# Patient Record
Sex: Male | Born: 1955 | Race: White | Hispanic: No | Smoking: Never smoker
Health system: Southern US, Community
[De-identification: ages and names within clinical notes are randomized; demographics above are authoritative.]

## PROBLEM LIST (undated history)

## (undated) DIAGNOSIS — T4145XA Adverse effect of unspecified anesthetic, initial encounter: Secondary | ICD-10-CM

## (undated) DIAGNOSIS — F419 Anxiety disorder, unspecified: Secondary | ICD-10-CM

## (undated) DIAGNOSIS — I10 Essential (primary) hypertension: Secondary | ICD-10-CM

## (undated) DIAGNOSIS — T8859XA Other complications of anesthesia, initial encounter: Secondary | ICD-10-CM

## (undated) DIAGNOSIS — Z9109 Other allergy status, other than to drugs and biological substances: Secondary | ICD-10-CM

## (undated) DIAGNOSIS — Z91030 Bee allergy status: Secondary | ICD-10-CM

## (undated) DIAGNOSIS — L570 Actinic keratosis: Secondary | ICD-10-CM

## (undated) HISTORY — DX: Other allergy status, other than to drugs and biological substances: Z91.09

## (undated) HISTORY — DX: Actinic keratosis: L57.0

## (undated) HISTORY — PX: INGUINAL HERNIA REPAIR: SHX194

## (undated) HISTORY — DX: Essential (primary) hypertension: I10

## (undated) HISTORY — DX: Anxiety disorder, unspecified: F41.9

## (undated) HISTORY — DX: Bee allergy status: Z91.030

---

## 2016-06-17 ENCOUNTER — Telehealth: Payer: Self-pay

## 2016-06-17 NOTE — Telephone Encounter (Signed)
Pt called and is interested in doing the Cologuard first. Michela Pitcher he is not having any problems at all. Please advise!

## 2016-06-17 NOTE — Telephone Encounter (Signed)
PT left a Vm that he received letter for screening colonoscopy and wants to discuss options.  I called and LMOM for a return call.

## 2016-06-17 NOTE — Telephone Encounter (Signed)
Cologuard is a stool test. We have not established care with patient so we cannot order that like we triage patient's for colonoscopies.   He can come in to be seen, we can discuss the test and order if that is what he wants to do.   It is important to know that Cologuard is not a colon cancer PREVENTION tool. It is a tool to SCREEN for colon cancer and if it is positive, then he will need a colonoscopy.

## 2016-06-20 NOTE — Telephone Encounter (Signed)
Pt is aware. OV with Neil Crouch, PA on 07/08/2016 at 11:00 Am .

## 2016-07-08 ENCOUNTER — Encounter: Payer: Self-pay | Admitting: Gastroenterology

## 2016-07-08 ENCOUNTER — Ambulatory Visit (INDEPENDENT_AMBULATORY_CARE_PROVIDER_SITE_OTHER): Payer: 59 | Admitting: Gastroenterology

## 2016-07-08 DIAGNOSIS — Z1211 Encounter for screening for malignant neoplasm of colon: Secondary | ICD-10-CM | POA: Diagnosis not present

## 2016-07-08 NOTE — Progress Notes (Signed)
Primary Care Physician:  Robert Bellow, MD  Primary Gastroenterologist:  Garfield Cornea, MD   Chief Complaint  Patient presents with  . Advice Only    HPI:  Philip Johnson is a 60 y.o. male here To discuss screening for colon cancer. He is not interested in colonoscopy. He would like to consider Cologuard. He has done his research. We discussed that this test screens for colon cancer but is not used for prevention of colon cancer as a colonoscopy is. He is fully aware. He does note that if the test is positive he is willing to undergo colonoscopy. He denies any bowel concerns. No blood in the stool or melena. No constipation, diarrhea, abdominal pain, heartburn, dysphagia, vomiting, weight loss. No known family history of colon cancer.  Current Outpatient Prescriptions  Medication Sig Dispense Refill  . atenolol (TENORMIN) 25 MG tablet 25 mg daily.     . diazepam (VALIUM) 5 MG tablet Take 5 mg by mouth every 6 (six) hours as needed.     Marland Kitchen EPIPEN 2-PAK 0.3 MG/0.3ML SOAJ injection 0.3 mg once.     . fluticasone (FLONASE) 50 MCG/ACT nasal spray Place 2 sprays into both nostrils daily.      No current facility-administered medications for this visit.     Allergies as of 07/08/2016 - Review Complete 07/08/2016  Allergen Reaction Noted  . Bee venom Anaphylaxis 07/08/2016    Past Medical History:  Diagnosis Date  . Anxiety   . Hypertension   . Multiple environmental allergies   . Yellow jacket sting allergy     Past Surgical History:  Procedure Laterality Date  . INGUINAL HERNIA REPAIR     right    Family History  Problem Relation Age of Onset  . Prostate cancer Paternal Grandfather   . Prostate cancer Paternal Uncle   . Colon cancer Neg Hx     Social History   Social History  . Marital status: Married    Spouse name: N/A  . Number of children: N/A  . Years of education: N/A   Occupational History  . Not on file.   Social History Main Topics  . Smoking status:  Never Smoker  . Smokeless tobacco: Never Used  . Alcohol use Yes     Comment: socially, per PCP chart, etoh dependence in remission  . Drug use: No  . Sexual activity: Not on file   Other Topics Concern  . Not on file   Social History Narrative   Works at [employer]:  General: Negative for anorexia, weight loss, fever, chills, fatigue, weakness. Eyes: Negative for vision changes.  ENT: Negative for hoarseness, difficulty swallowing , nasal congestion. CV: Negative for chest pain, angina, palpitations, dyspnea on exertion, peripheral edema.  Respiratory: Negative for dyspnea at rest, dyspnea on exertion, cough, sputum, wheezing.  GI: See history of present illness. GU:  Negative for dysuria, hematuria, urinary incontinence, urinary frequency, nocturnal urination.  MS: Negative for joint pain, low back pain.  Derm: Negative for rash or itching.  Neuro: Negative for weakness, abnormal sensation, seizure, frequent headaches, memory loss, confusion.  Psych: Negative for anxiety, depression, suicidal ideation, hallucinations.  Endo: Negative for unusual weight change.  Heme: Negative for bruising or bleeding. Allergy: Negative for rash or hives.    Physical Examination:  BP 133/80   Pulse (!) 56   Temp 98 F (36.7 C) (Oral)   Ht 5\' 4"  (1.626 m)   Wt 139 lb (  63 kg)   BMI 23.86 kg/m    General: Well-nourished, well-developed in no acute distress.  Head: Normocephalic, atraumatic.   Eyes: Conjunctiva pink, no icterus. Mouth: Oropharyngeal mucosa moist and pink , no lesions erythema or exudate. Neck: Supple without thyromegaly, masses, or lymphadenopathy.  Lungs: Clear to auscultation bilaterally.  Heart: Regular rate and rhythm, no murmurs rubs or gallops.  Abdomen: Bowel sounds are normal, nontender, nondistended, no hepatosplenomegaly or masses, no abdominal bruits or    hernia , no rebound or guarding.   Rectal: Not performed Extremities: No lower  extremity edema. No clubbing or deformities.  Neuro: Alert and oriented x 4 , grossly normal neurologically.  Skin: Warm and dry, no rash or jaundice.   Psych: Alert and cooperative, normal mood and affect.  Labs: None available  Imaging Studies: No results found.

## 2016-07-08 NOTE — Assessment & Plan Note (Signed)
60 year old gentleman with no prior colonoscopy he would like to be screened for colon cancer via Cologuard. He tells me he accepts the risk that this is not a preventative tool but unless he has a positive Cologuard test he does not plan to have colonoscopy for preventative reasons. Plan for Cologuard in near future.

## 2016-07-08 NOTE — Patient Instructions (Signed)
1. Please let us know when you have sent the Cologuard test off. We will contact you with results when available.

## 2016-07-11 NOTE — Progress Notes (Signed)
cc'ed to pcp °

## 2016-07-24 ENCOUNTER — Encounter: Payer: Self-pay | Admitting: Gastroenterology

## 2016-07-25 ENCOUNTER — Telehealth: Payer: Self-pay | Admitting: Gastroenterology

## 2016-07-25 NOTE — Telephone Encounter (Signed)
PATIENT CALLED AND STATED THAT HE MAILED HIS COLOGUARD THIS MORNING

## 2016-07-27 NOTE — Telephone Encounter (Signed)
Noted. Let's make sure we keep look out for results.

## 2016-07-29 LAB — COLOGUARD: COLOGUARD: NEGATIVE

## 2016-08-01 NOTE — Telephone Encounter (Signed)
Received cologuard results and placed them in LSL cart.

## 2016-08-02 NOTE — Telephone Encounter (Signed)
Pt is aware of results. 

## 2016-08-02 NOTE — Telephone Encounter (Signed)
Please let patient know his Cologuard test was negative.  Would recommend another Cologuard test in 3 years.  He could consider yearly hemoccult testing via PCP as well.  Send copy of results to PCP.

## 2016-08-02 NOTE — Telephone Encounter (Signed)
LMOM to call back

## 2016-12-26 ENCOUNTER — Ambulatory Visit (HOSPITAL_COMMUNITY)
Admission: EM | Admit: 2016-12-26 | Discharge: 2016-12-26 | Disposition: A | Discharge status: Home / Self Care | Payer: Commercial Managed Care - HMO | Attending: Internal Medicine | Admitting: Internal Medicine

## 2016-12-26 ENCOUNTER — Encounter (HOSPITAL_COMMUNITY): Payer: Self-pay | Admitting: Emergency Medicine

## 2016-12-26 DIAGNOSIS — R0981 Nasal congestion: Secondary | ICD-10-CM

## 2016-12-26 DIAGNOSIS — Z87898 Personal history of other specified conditions: Secondary | ICD-10-CM

## 2016-12-26 DIAGNOSIS — B349 Viral infection, unspecified: Secondary | ICD-10-CM

## 2016-12-26 MED ORDER — BENZONATATE 200 MG PO CAPS: 200.0000 mg | ORAL_CAPSULE | Freq: Three times a day (TID) | ORAL | 1 refills | Status: DC | PRN

## 2016-12-26 NOTE — ED Provider Notes (Signed)
[facility]    CSN: [id number] Arrival date & time: 12/26/16  1007     History   Chief Complaint Chief Complaint  Patient presents with  . Cough    HPI ZAKHARI FOGEL is a 61 y.o. male. He presents today with onset of throbbing headache and fever 3d ago.  Temp 102 on 4/7, 100 yesterday.  Headache improving.  Now with some cough, scant production.  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  Requests 7d work note.  Missed work 4/6.       HPI  Past Medical History:  Diagnosis Date  . Anxiety   . Hypertension   . Multiple environmental allergies   . Yellow jacket sting allergy     Patient Active Problem List   Diagnosis Date Noted  . Colon cancer screening 07/08/2016    Past Surgical History:  Procedure Laterality Date  . INGUINAL HERNIA REPAIR     right       Home Medications    Prior to Admission medications   Medication Sig Start Date End Date Taking? Authorizing Provider  atenolol (TENORMIN) 25 MG tablet 25 mg daily.  06/14/16  Yes Historical Provider, MD  diazepam (VALIUM) 5 MG tablet Take 5 mg by mouth every 6 (six) hours as needed.  06/14/16  Yes Historical Provider, MD  EPIPEN 2-PAK 0.3 MG/0.3ML SOAJ injection 0.3 mg once.  04/15/16  Yes Historical Provider, MD  fexofenadine-pseudoephedrine (ALLEGRA-D 24) 180-240 MG 24 hr tablet Take 1 tablet by mouth daily.   Yes Historical Provider, MD  fluticasone (FLONASE) 50 MCG/ACT nasal spray Place 2 sprays into both nostrils daily.  06/14/16  Yes Historical Provider, MD  benzonatate (TESSALON) 200 MG capsule Take 1 capsule (200 mg total) by mouth 3 (three) times daily as needed for cough. 12/26/16   Sherlene Shams, MD    Family History Family History  Problem Relation Age of Onset  . Prostate cancer Paternal Grandfather   . Prostate cancer Paternal Uncle   . Colon cancer Neg Hx     Social History Social History  Substance Use Topics  .  Smoking status: Never Smoker  . Smokeless tobacco: Never Used  . Alcohol use Yes     Comment: socially, per PCP chart, etoh dependence in remission     Allergies   Bee venom   Review of Systems Review of Systems  All other systems reviewed and are negative.    Physical Exam Triage Vital Signs ED Triage Vitals  Enc Vitals Group     BP 12/26/16 1026 116/78     Pulse Rate 12/26/16 1026 74     Resp 12/26/16 1026 16     Temp 12/26/16 1026 97.9 F (36.6 C)     Temp Source 12/26/16 1026 Oral     SpO2 12/26/16 1026 98 %     Weight --      Height --      Pain Score 12/26/16 1030 3     Pain Loc --    Updated Vital Signs BP 116/78 (BP Location: Left Arm)   Pulse 74   Temp 97.9 F (36.6 C) (Oral)   Resp 16   SpO2 98%   Physical Exam  Constitutional: He is oriented to person, place, and time. No distress.  Alert, nicely groomed  HENT:  Head: Atraumatic.  B TMs moderately dull, no erythema Moderate nasal congestion with scant mucusy material present Throat injected with  post nasal drainage evident  Eyes:  Conjugate gaze, no eye redness/drainage  Neck: Neck supple.  Cardiovascular: Normal rate and regular rhythm.   Pulmonary/Chest: No respiratory distress. He has no wheezes. He has no rales.  Lungs clear, symmetric breath sounds   Abdominal: He exhibits no distension.  Musculoskeletal: Normal range of motion.  No distal leg swelling   Neurological: He is alert and oriented to person, place, and time.  Skin: Skin is warm and dry.  No cyanosis.  Pink   Nursing note and vitals reviewed.    UC Treatments / Results   Procedures Procedures (including critical care time) None today  Final Clinical Impressions(s) / UC Diagnoses   Final diagnoses:  Nonspecific syndrome suggestive of viral illness  History of fever  Sinus congestion   Symptoms and physical exam today suggest a viral respiratory infection, which is improving by your report.  Fever has resolved  and headache is improving.  Recheck if new fever >100.5, increasing phlegm production/nasal discharge, or if not starting to improve.  Please recheck with your primary care provider if symptoms are not continuing to improve or further extension of the work note is needed.  Prescriptions for benzonatate ( a cough gel) was sent to the [facility] on [address].    New Prescriptions Discharge Medication List as of 12/26/2016 11:05 AM    START taking these medications   Details  benzonatate (TESSALON) 200 MG capsule Take 1 capsule (200 mg total) by mouth 3 (three) times daily as needed for cough., Starting Mon 12/26/2016, Normal         Sherlene Shams, MD 12/28/16 2303

## 2016-12-26 NOTE — Discharge Instructions (Addendum)
Symptoms and physical exam today suggest a viral respiratory infection, which is improving by your report.  Fever has resolved and headache is improving.  Recheck if new fever >100.5, increasing phlegm production/nasal discharge, or if not starting to improve.  Please recheck with your primary care provider if symptoms are not continuing to improve or further extension of the work note is needed.  Prescriptions for benzonatate ( a cough gel) was sent to the [facility] on [address].

## 2016-12-26 NOTE — ED Triage Notes (Signed)
The patient presented to the [facility] with a complaint of a headache , fever and cough off and on for 4 days.

## 2018-01-16 ENCOUNTER — Ambulatory Visit: Payer: Self-pay | Admitting: General Surgery

## 2018-03-29 ENCOUNTER — Encounter (HOSPITAL_BASED_OUTPATIENT_CLINIC_OR_DEPARTMENT_OTHER): Payer: Self-pay | Admitting: *Deleted

## 2018-03-30 ENCOUNTER — Encounter (HOSPITAL_BASED_OUTPATIENT_CLINIC_OR_DEPARTMENT_OTHER)
Admission: RE | Admit: 2018-03-30 | Discharge: 2018-03-30 | Disposition: A | Discharge status: Home / Self Care | Payer: 59 | Source: Ambulatory Visit | Attending: General Surgery | Admitting: General Surgery

## 2018-03-30 ENCOUNTER — Ambulatory Visit
Admission: RE | Admit: 2018-03-30 | Discharge: 2018-03-30 | Disposition: A | Discharge status: Home / Self Care | Payer: 59 | Source: Ambulatory Visit | Attending: General Surgery | Admitting: General Surgery

## 2018-03-30 ENCOUNTER — Other Ambulatory Visit: Payer: Self-pay | Admitting: General Surgery

## 2018-03-30 DIAGNOSIS — F419 Anxiety disorder, unspecified: Secondary | ICD-10-CM | POA: Diagnosis not present

## 2018-03-30 DIAGNOSIS — Z79899 Other long term (current) drug therapy: Secondary | ICD-10-CM | POA: Diagnosis not present

## 2018-03-30 DIAGNOSIS — Z01811 Encounter for preprocedural respiratory examination: Secondary | ICD-10-CM

## 2018-03-30 DIAGNOSIS — K4090 Unilateral inguinal hernia, without obstruction or gangrene, not specified as recurrent: Secondary | ICD-10-CM | POA: Diagnosis present

## 2018-03-30 DIAGNOSIS — I10 Essential (primary) hypertension: Secondary | ICD-10-CM | POA: Diagnosis not present

## 2018-03-30 DIAGNOSIS — Z91030 Bee allergy status: Secondary | ICD-10-CM | POA: Diagnosis not present

## 2018-03-30 DIAGNOSIS — Z87891 Personal history of nicotine dependence: Secondary | ICD-10-CM | POA: Diagnosis not present

## 2018-03-30 LAB — BASIC METABOLIC PANEL
Anion gap: 6 (ref 5–15)
BUN: 22 mg/dL (ref 8–23)
CALCIUM: 8.9 mg/dL (ref 8.9–10.3)
CO2: 29 mmol/L (ref 22–32)
CREATININE: 0.89 mg/dL (ref 0.61–1.24)
Chloride: 107 mmol/L (ref 98–111)
GFR calc Af Amer: >60 mL/min (ref >60)
Glucose, Bld: 114 mg/dL — ABNORMAL HIGH (ref 70–99)
POTASSIUM: 4.3 mmol/L (ref 3.5–5.1)
SODIUM: 142 mmol/L (ref 135–145)

## 2018-03-30 LAB — CBC WITH DIFFERENTIAL/PLATELET
Abs Immature Granulocytes: 0 10*3/uL (ref 0.0–0.1)
BASOS ABS: 0 10*3/uL (ref 0.0–0.1)
Basophils Relative: 1 %
EOS PCT: 3 %
Eosinophils Absolute: 0.2 10*3/uL (ref 0.0–0.7)
HCT: 44.9 % (ref 39.0–52.0)
Hemoglobin: 14.6 g/dL (ref 13.0–17.0)
Immature Granulocytes: 0 %
Lymphocytes Relative: 29 %
Lymphs Abs: 1.7 10*3/uL (ref 0.7–4.0)
MCH: 31.9 pg (ref 26.0–34.0)
MCHC: 32.5 g/dL (ref 30.0–36.0)
MCV: 98 fL (ref 78.0–100.0)
Monocytes Absolute: 0.4 10*3/uL (ref 0.1–1.0)
Monocytes Relative: 7 %
Neutro Abs: 3.5 10*3/uL (ref 1.7–7.7)
Neutrophils Relative %: 60 %
Platelets: 244 10*3/uL (ref 150–400)
RBC: 4.58 MIL/uL (ref 4.22–5.81)
RDW: 12.5 % (ref 11.5–15.5)
WBC: 5.8 10*3/uL (ref 4.0–10.5)

## 2018-03-30 NOTE — Progress Notes (Signed)
Ensure pre surgery drink given with instructions to complete by [facility], pt verbalized understanding.  Faxed order for chest xray to [facility] imaging, gave pt instructions and directions, pt verbalized understanding.

## 2018-04-05 NOTE — Anesthesia Preprocedure Evaluation (Addendum)
Anesthesia Evaluation  Patient identified by MRN, date of birth, ID band Patient awake    Reviewed: Allergy & Precautions, NPO status , Patient's Chart, lab work & pertinent test results, reviewed documented beta blocker date and time   Airway Mallampati: II  TM Distance: >3 FB Neck ROM: Full    Dental no notable dental hx. (+) Teeth Intact, Caps   Pulmonary neg pulmonary ROS,    Pulmonary exam normal breath sounds clear to auscultation       Cardiovascular hypertension, Pt. on medications and Pt. on home beta blockers Normal cardiovascular exam Rhythm:Regular Rate:Normal     Neuro/Psych Anxiety negative neurological ROS     GI/Hepatic negative GI ROS,   Endo/Other    Renal/GU      Musculoskeletal   Abdominal   Peds  Hematology negative hematology ROS (+)   Anesthesia Other Findings   Reproductive/Obstetrics                             Lab Results  Component Value Date   WBC 5.8 03/30/2018   HGB 14.6 03/30/2018   HCT 44.9 03/30/2018   MCV 98.0 03/30/2018   PLT 244 03/30/2018    Anesthesia Physical Anesthesia Plan  ASA: II  Anesthesia Plan: General   Post-op Pain Management:  Regional for Post-op pain   Induction: Intravenous  PONV Risk Score and Plan: Treatment may vary due to age or medical condition, Dexamethasone and Ondansetron  Airway Management Planned: Oral ETT  Additional Equipment:   Intra-op Plan:   Post-operative Plan:   Informed Consent: I have reviewed the patients History and Physical, chart, labs and discussed the procedure including the risks, benefits and alternatives for the proposed anesthesia with the patient or authorized representative who has indicated his/her understanding and acceptance.   Dental advisory given  Plan Discussed with: CRNA  Anesthesia Plan Comments: (TAP block)        Anesthesia Quick Evaluation

## 2018-04-06 ENCOUNTER — Ambulatory Visit (HOSPITAL_COMMUNITY): Payer: 59

## 2018-04-06 ENCOUNTER — Ambulatory Visit (HOSPITAL_BASED_OUTPATIENT_CLINIC_OR_DEPARTMENT_OTHER)
Admission: RE | Admit: 2018-04-06 | Discharge: 2018-04-06 | Disposition: A | Discharge status: Home / Self Care | Payer: 59 | Source: Ambulatory Visit | Attending: General Surgery | Admitting: General Surgery

## 2018-04-06 ENCOUNTER — Ambulatory Visit (HOSPITAL_BASED_OUTPATIENT_CLINIC_OR_DEPARTMENT_OTHER): Payer: 59 | Admitting: Anesthesiology

## 2018-04-06 ENCOUNTER — Encounter (HOSPITAL_BASED_OUTPATIENT_CLINIC_OR_DEPARTMENT_OTHER): Payer: Self-pay

## 2018-04-06 ENCOUNTER — Other Ambulatory Visit: Payer: Self-pay

## 2018-04-06 ENCOUNTER — Encounter (HOSPITAL_BASED_OUTPATIENT_CLINIC_OR_DEPARTMENT_OTHER)
Admission: RE | Disposition: A | Discharge status: Home / Self Care | Payer: Self-pay | Source: Ambulatory Visit | Attending: General Surgery

## 2018-04-06 DIAGNOSIS — I10 Essential (primary) hypertension: Secondary | ICD-10-CM | POA: Insufficient documentation

## 2018-04-06 DIAGNOSIS — Z01818 Encounter for other preprocedural examination: Secondary | ICD-10-CM

## 2018-04-06 DIAGNOSIS — K4090 Unilateral inguinal hernia, without obstruction or gangrene, not specified as recurrent: Secondary | ICD-10-CM | POA: Insufficient documentation

## 2018-04-06 DIAGNOSIS — Z79899 Other long term (current) drug therapy: Secondary | ICD-10-CM | POA: Insufficient documentation

## 2018-04-06 DIAGNOSIS — F419 Anxiety disorder, unspecified: Secondary | ICD-10-CM | POA: Insufficient documentation

## 2018-04-06 DIAGNOSIS — Z87891 Personal history of nicotine dependence: Secondary | ICD-10-CM | POA: Insufficient documentation

## 2018-04-06 DIAGNOSIS — Z91030 Bee allergy status: Secondary | ICD-10-CM | POA: Insufficient documentation

## 2018-04-06 HISTORY — DX: Adverse effect of unspecified anesthetic, initial encounter: T41.45XA

## 2018-04-06 HISTORY — PX: INSERTION OF MESH: SHX5868

## 2018-04-06 HISTORY — DX: Other complications of anesthesia, initial encounter: T88.59XA

## 2018-04-06 HISTORY — PX: INGUINAL HERNIA REPAIR: SHX194

## 2018-04-06 SURGERY — REPAIR, HERNIA, INGUINAL, ADULT
Anesthesia: General | Site: Groin | Laterality: Left

## 2018-04-06 MED ORDER — PROPOFOL 10 MG/ML IV BOLUS
INTRAVENOUS | Status: DC | PRN
  Administered 2018-04-06: 180 mg via INTRAVENOUS

## 2018-04-06 MED ORDER — CEFAZOLIN SODIUM-DEXTROSE 2-4 GM/100ML-% IV SOLN
INTRAVENOUS | Status: AC
  Filled 2018-04-06: qty 100

## 2018-04-06 MED ORDER — PROPOFOL 10 MG/ML IV BOLUS
INTRAVENOUS | Status: AC
  Filled 2018-04-06: qty 40

## 2018-04-06 MED ORDER — LIDOCAINE HCL (PF) 1 % IJ SOLN
INTRAMUSCULAR | Status: AC
  Filled 2018-04-06: qty 30

## 2018-04-06 MED ORDER — GABAPENTIN 300 MG PO CAPS
ORAL_CAPSULE | ORAL | Status: AC
  Filled 2018-04-06: qty 1

## 2018-04-06 MED ORDER — CHLORHEXIDINE GLUCONATE CLOTH 2 % EX PADS: 6.0000 | MEDICATED_PAD | Freq: Once | CUTANEOUS | Status: DC

## 2018-04-06 MED ORDER — HYDROCODONE-ACETAMINOPHEN 5-325 MG PO TABS
ORAL_TABLET | ORAL | Status: AC
  Filled 2018-04-06: qty 1

## 2018-04-06 MED ORDER — BUPIVACAINE HCL (PF) 0.5 % IJ SOLN
INTRAMUSCULAR | Status: AC
  Filled 2018-04-06: qty 30

## 2018-04-06 MED ORDER — LIDOCAINE HCL (CARDIAC) PF 100 MG/5ML IV SOSY
PREFILLED_SYRINGE | INTRAVENOUS | Status: DC | PRN
  Administered 2018-04-06: 60 mg via INTRAVENOUS

## 2018-04-06 MED ORDER — MIDAZOLAM HCL 2 MG/2ML IJ SOLN
1.0000 mg | INTRAMUSCULAR | Status: DC | PRN
  Administered 2018-04-06: 2 mg via INTRAVENOUS
  Administered 2018-04-06 (×2): 1 mg via INTRAVENOUS

## 2018-04-06 MED ORDER — ROCURONIUM BROMIDE 10 MG/ML (PF) SYRINGE
PREFILLED_SYRINGE | INTRAVENOUS | Status: AC
  Filled 2018-04-06: qty 10

## 2018-04-06 MED ORDER — ACETAMINOPHEN 500 MG PO TABS
ORAL_TABLET | ORAL | Status: AC
  Filled 2018-04-06: qty 2

## 2018-04-06 MED ORDER — SODIUM BICARBONATE 4 % IV SOLN
INTRAVENOUS | Status: AC
  Filled 2018-04-06: qty 5

## 2018-04-06 MED ORDER — FENTANYL CITRATE (PF) 100 MCG/2ML IJ SOLN
50.0000 ug | INTRAMUSCULAR | Status: DC | PRN
  Administered 2018-04-06 (×2): 50 ug via INTRAVENOUS

## 2018-04-06 MED ORDER — DIPHENHYDRAMINE HCL 50 MG/ML IJ SOLN
INTRAMUSCULAR | Status: DC | PRN
  Administered 2018-04-06: 25 mg via INTRAVENOUS

## 2018-04-06 MED ORDER — GABAPENTIN 300 MG PO CAPS
300.0000 mg | ORAL_CAPSULE | ORAL | Status: AC
  Administered 2018-04-06: 300 mg via ORAL

## 2018-04-06 MED ORDER — BUPIVACAINE-EPINEPHRINE (PF) 0.5% -1:200000 IJ SOLN
INTRAMUSCULAR | Status: AC
  Filled 2018-04-06: qty 30

## 2018-04-06 MED ORDER — CEFAZOLIN SODIUM-DEXTROSE 2-3 GM-%(50ML) IV SOLR
INTRAVENOUS | Status: DC | PRN
  Administered 2018-04-06: 2 g via INTRAVENOUS

## 2018-04-06 MED ORDER — MIDAZOLAM HCL 2 MG/2ML IJ SOLN
INTRAMUSCULAR | Status: AC
  Filled 2018-04-06: qty 2

## 2018-04-06 MED ORDER — CELECOXIB 200 MG PO CAPS
ORAL_CAPSULE | ORAL | Status: AC
  Filled 2018-04-06: qty 1

## 2018-04-06 MED ORDER — SODIUM CHLORIDE 0.9 % IV SOLN
INTRAVENOUS | Status: DC | PRN
  Administered 2018-04-06: 500 mL

## 2018-04-06 MED ORDER — LIDOCAINE HCL (CARDIAC) PF 100 MG/5ML IV SOSY
PREFILLED_SYRINGE | INTRAVENOUS | Status: AC
  Filled 2018-04-06: qty 5

## 2018-04-06 MED ORDER — CELECOXIB 200 MG PO CAPS
200.0000 mg | ORAL_CAPSULE | ORAL | Status: AC
  Administered 2018-04-06: 200 mg via ORAL

## 2018-04-06 MED ORDER — MEPERIDINE HCL 25 MG/ML IJ SOLN: 6.2500 mg | INTRAMUSCULAR | Status: DC | PRN

## 2018-04-06 MED ORDER — PROMETHAZINE HCL 25 MG/ML IJ SOLN: 6.2500 mg | INTRAMUSCULAR | Status: DC | PRN

## 2018-04-06 MED ORDER — ACETAMINOPHEN 10 MG/ML IV SOLN: 1000.0000 mg | Freq: Once | INTRAVENOUS | Status: DC | PRN

## 2018-04-06 MED ORDER — OXYCODONE HCL 5 MG PO TABS: 5.0000 mg | ORAL_TABLET | Freq: Four times a day (QID) | ORAL | 0 refills | Status: DC | PRN

## 2018-04-06 MED ORDER — SCOPOLAMINE 1 MG/3DAYS TD PT72: 1.0000 | MEDICATED_PATCH | Freq: Once | TRANSDERMAL | Status: DC | PRN

## 2018-04-06 MED ORDER — DEXAMETHASONE SODIUM PHOSPHATE 10 MG/ML IJ SOLN
INTRAMUSCULAR | Status: DC | PRN
  Administered 2018-04-06: 10 mg via INTRAVENOUS

## 2018-04-06 MED ORDER — BUPIVACAINE-EPINEPHRINE 0.5% -1:200000 IJ SOLN
INTRAMUSCULAR | Status: DC | PRN
  Administered 2018-04-06: 10 mL

## 2018-04-06 MED ORDER — HYDROCODONE-ACETAMINOPHEN 7.5-325 MG PO TABS
1.0000 | ORAL_TABLET | Freq: Once | ORAL | Status: AC | PRN
  Administered 2018-04-06: 1 via ORAL

## 2018-04-06 MED ORDER — LACTATED RINGERS IV SOLN
INTRAVENOUS | Status: DC
  Administered 2018-04-06 (×2): via INTRAVENOUS

## 2018-04-06 MED ORDER — HYDROMORPHONE HCL 1 MG/ML IJ SOLN: 0.2500 mg | INTRAMUSCULAR | Status: DC | PRN

## 2018-04-06 MED ORDER — DEXAMETHASONE SODIUM PHOSPHATE 10 MG/ML IJ SOLN
INTRAMUSCULAR | Status: AC
  Filled 2018-04-06: qty 1

## 2018-04-06 MED ORDER — SODIUM CHLORIDE 0.9 % IV SOLN
INTRAVENOUS | Status: AC
  Filled 2018-04-06: qty 500000

## 2018-04-06 MED ORDER — FENTANYL CITRATE (PF) 100 MCG/2ML IJ SOLN
INTRAMUSCULAR | Status: AC
  Filled 2018-04-06: qty 2

## 2018-04-06 MED ORDER — ONDANSETRON HCL 4 MG/2ML IJ SOLN
INTRAMUSCULAR | Status: AC
  Filled 2018-04-06: qty 2

## 2018-04-06 MED ORDER — ROPIVACAINE HCL 5 MG/ML IJ SOLN
INTRAMUSCULAR | Status: DC | PRN
  Administered 2018-04-06: 25 mL

## 2018-04-06 MED ORDER — CEFAZOLIN SODIUM-DEXTROSE 2-4 GM/100ML-% IV SOLN: 2.0000 g | INTRAVENOUS | Status: DC

## 2018-04-06 MED ORDER — ACETAMINOPHEN 500 MG PO TABS
1000.0000 mg | ORAL_TABLET | ORAL | Status: AC
  Administered 2018-04-06: 1000 mg via ORAL

## 2018-04-06 SURGICAL SUPPLY — 54 items
BAG DECANTER FOR FLEXI CONT (MISCELLANEOUS) ×3 IMPLANT
BLADE CLIPPER SURG (BLADE) ×3 IMPLANT
BLADE SURG 10 STRL SS (BLADE) ×3 IMPLANT
BLADE SURG 15 STRL LF DISP TIS (BLADE) ×2 IMPLANT
BLADE SURG 15 STRL SS (BLADE) ×4
CANISTER SUCT 1200ML W/VALVE (MISCELLANEOUS) IMPLANT
CHLORAPREP W/TINT 26ML (MISCELLANEOUS) ×3 IMPLANT
CLEANER CAUTERY TIP 5X5 PAD (MISCELLANEOUS) ×1 IMPLANT
CLOSURE WOUND 1/2 X4 (GAUZE/BANDAGES/DRESSINGS) ×1
COVER BACK TABLE 60X90IN (DRAPES) ×3 IMPLANT
COVER MAYO STAND STRL (DRAPES) ×3 IMPLANT
DECANTER SPIKE VIAL GLASS SM (MISCELLANEOUS) ×3 IMPLANT
DERMABOND ADVANCED (GAUZE/BANDAGES/DRESSINGS) ×2
DERMABOND ADVANCED .7 DNX12 (GAUZE/BANDAGES/DRESSINGS) ×1 IMPLANT
DRAIN PENROSE 1/2X12 LTX STRL (WOUND CARE) ×3 IMPLANT
DRAPE LAPAROTOMY TRNSV 102X78 (DRAPE) ×3 IMPLANT
DRAPE UTILITY XL STRL (DRAPES) ×3 IMPLANT
DRSG TEGADERM 2-3/8X2-3/4 SM (GAUZE/BANDAGES/DRESSINGS) IMPLANT
DRSG TEGADERM 4X4.75 (GAUZE/BANDAGES/DRESSINGS) ×3 IMPLANT
ELECT REM PT RETURN 9FT ADLT (ELECTROSURGICAL) ×3
ELECTRODE REM PT RTRN 9FT ADLT (ELECTROSURGICAL) ×1 IMPLANT
GLOVE BIO SURGEON STRL SZ 6.5 (GLOVE) ×4 IMPLANT
GLOVE BIO SURGEONS STRL SZ 6.5 (GLOVE) ×2
GLOVE BIOGEL PI IND STRL 8 (GLOVE) ×1 IMPLANT
GLOVE BIOGEL PI INDICATOR 8 (GLOVE) ×2
GLOVE ECLIPSE 7.5 STRL STRAW (GLOVE) ×3 IMPLANT
GOWN STRL REUS W/ TWL LRG LVL3 (GOWN DISPOSABLE) ×1 IMPLANT
GOWN STRL REUS W/ TWL XL LVL3 (GOWN DISPOSABLE) ×1 IMPLANT
GOWN STRL REUS W/TWL LRG LVL3 (GOWN DISPOSABLE) ×2
GOWN STRL REUS W/TWL XL LVL3 (GOWN DISPOSABLE) ×2
MESH HERNIA 3X6 (Mesh General) ×3 IMPLANT
NEEDLE HYPO 25X1 1.5 SAFETY (NEEDLE) ×3 IMPLANT
NS IRRIG 1000ML POUR BTL (IV SOLUTION) IMPLANT
PACK BASIN DAY SURGERY FS (CUSTOM PROCEDURE TRAY) ×3 IMPLANT
PAD CLEANER CAUTERY TIP 5X5 (MISCELLANEOUS) ×2
PENCIL BUTTON HOLSTER BLD 10FT (ELECTRODE) ×3 IMPLANT
SLEEVE SCD COMPRESS KNEE MED (MISCELLANEOUS) ×3 IMPLANT
SPONGE INTESTINAL PEANUT (DISPOSABLE) ×3 IMPLANT
SPONGE LAP 4X18 RFD (DISPOSABLE) ×3 IMPLANT
STRIP CLOSURE SKIN 1/2X4 (GAUZE/BANDAGES/DRESSINGS) ×2 IMPLANT
SUT ETHIBOND 0 MO6 C/R (SUTURE) ×3 IMPLANT
SUT MON AB 4-0 PC3 18 (SUTURE) ×3 IMPLANT
SUT PROLENE 0 CT 2 (SUTURE) ×6 IMPLANT
SUT VIC AB 3-0 SH 27 (SUTURE) ×4
SUT VIC AB 3-0 SH 27X BRD (SUTURE) ×2 IMPLANT
SUT VICRYL 4-0 PS2 18IN ABS (SUTURE) ×3 IMPLANT
SUT VICRYL AB 3 0 TIES (SUTURE) ×3 IMPLANT
SYR BULB 3OZ (MISCELLANEOUS) ×3 IMPLANT
SYR CONTROL 10ML LL (SYRINGE) ×3 IMPLANT
TOWEL GREEN STERILE FF (TOWEL DISPOSABLE) ×3 IMPLANT
TOWEL OR NON WOVEN STRL DISP B (DISPOSABLE) IMPLANT
TUBE CONNECTING 20'X1/4 (TUBING)
TUBE CONNECTING 20X1/4 (TUBING) IMPLANT
YANKAUER SUCT BULB TIP NO VENT (SUCTIONS) IMPLANT

## 2018-04-06 NOTE — Anesthesia Procedure Notes (Signed)
Anesthesia Regional Block: TAP block   Pre-Anesthetic Checklist: ,, timeout performed, Correct Patient, Correct Site, Correct Laterality, Correct Procedure, Correct Position, site marked, Risks and benefits discussed, Surgical consent,  Pre-op evaluation,  Post-op pain management  Laterality: Left  Prep: Maximum Sterile Barrier Precautions used, chloraprep       Needles:  Injection technique: Single-shot  Needle Type: Echogenic Needle     Needle Length: 9cm  Needle Gauge: 22     Additional Needles:   Procedures:,,,, ultrasound used (permanent image in chart),,,,  Narrative:  Start time: 04/06/2018 7:02 AM End time: 04/06/2018 7:15 AM  Performed by: Personally  Anesthesiologist: Barnet Glasgow, MD

## 2018-04-06 NOTE — Transfer of Care (Signed)
Immediate Anesthesia Transfer of Care Note  Patient: Philip Johnson  Procedure(s) Performed: OPEN LEFT INGUINAL HERNIA REPAIR ERAS PATHWAY (Left Groin) INSERTION OF MESH (Left Groin)  Patient Location: PACU  Anesthesia Type:General  Level of Consciousness: awake, alert  and oriented  Airway & Oxygen Therapy: Patient Spontanous Breathing and Patient connected to face mask oxygen  Post-op Assessment: Report given to RN and Post -op Vital signs reviewed and stable  Post vital signs: Reviewed and stable  Last Vitals:  Vitals Value Taken Time  BP    Temp    Pulse    Resp    SpO2      Last Pain:  Vitals:   04/06/18 0641  TempSrc:   PainSc: 0-No pain         Complications: No apparent anesthesia complications

## 2018-04-06 NOTE — Progress Notes (Signed)
Assisted Dr. Houser with left, ultrasound guided, transabdominal plane block. Side rails up, monitors on throughout procedure. See vital signs in flow sheet. Tolerated Procedure well. °

## 2018-04-06 NOTE — Anesthesia Postprocedure Evaluation (Signed)
Anesthesia Post Note  Patient: Philip Johnson  Procedure(s) Performed: OPEN LEFT INGUINAL HERNIA REPAIR ERAS PATHWAY (Left Groin) INSERTION OF MESH (Left Groin)     Patient location during evaluation: PACU Anesthesia Type: General Level of consciousness: awake and alert Pain management: pain level controlled Vital Signs Assessment: post-procedure vital signs reviewed and stable Respiratory status: spontaneous breathing, nonlabored ventilation, respiratory function stable and patient connected to nasal cannula oxygen Cardiovascular status: blood pressure returned to baseline and stable Postop Assessment: no apparent nausea or vomiting Anesthetic complications: no    Last Vitals:  Vitals:   04/06/18 1001 04/06/18 1022  BP: (!) 146/72 (!) 146/72  Pulse: (!) 53 (!) 50  Resp: 12 16  Temp: (!) 36.4 C 36.4 C  SpO2: 100% 100%    Last Pain:  Vitals:   04/06/18 0945  TempSrc:   PainSc: 3                  Shelba Susi S

## 2018-04-06 NOTE — H&P (Signed)
Philip Johnson Documented: 01/16/2018 8:41 AM Location: [facility] Patient #: [id number] DOB: [birth year] Married / Language: Philip Johnson / Race: White Male   History of Present Illness Philip Johnson. Philip Skains MD; 01/16/2018 9:04 AM) The patient is a 62 year old male who presents with an inguinal hernia. The hernia(s) is/are located on the left side. Symptoms include inguinal bulge and inguinal pain. The pain is located in the left inguinal area. The patient describes the pain as dull. Onset was gradual 11 year(s) ago. There is no known event that preceded symptom onset. The symptoms occur constantly. The episodes last for 5 minutes. The patient describes this as mild. Symptoms are exacerbated by lifting and coughing.   Past Surgical History (Philip Johnson, [name]; 01/16/2018 8:41 AM) Open Inguinal Hernia Surgery  Right.  Diagnostic Studies History (Philip Johnson, [name]; 01/16/2018 8:41 AM) Colonoscopy  never  Allergies (Philip Johnson, [name]; 01/16/2018 8:42 AM) Yellow Jacket Venom Protein *Biologicals Misc**  Anaphylaxis. Allergies Reconciled   Medication History (Philip Johnson, [name]; 01/16/2018 8:43 AM) Chlordiazepoxide-Amitriptyline (5-12.5MG  Tablet, Oral) Active. DiazePAM (5MG  Tablet, Oral) Active. Atenolol (25MG  Tablet, Oral) Active. EPINEPHrine (0.3MG /0.3ML Soln Auto-inj, Injection) Active. Fluticasone Propionate (50MCG/ACT Suspension, Nasal) Active. Multi-Vitamin (Oral) Active. Omega 3 (Oral) Specific strength unknown - Active. Medications Reconciled  Social History (Philip Johnson, [name]; 01/16/2018 8:41 AM) Alcohol use  Moderate alcohol use. Caffeine use  Coffee. Illicit drug use  Remotely quit drug use. Tobacco use  Former smoker.  Family History (Philip Johnson, [name]; 01/16/2018 8:41 AM) Cerebrovascular Accident  Father. Hypertension  Brother, Father, Sister. Melanoma  Father. Respiratory Condition  Mother.  Other Problems (Philip Johnson, [name];  01/16/2018 8:41 AM) Anxiety Disorder  High blood pressure  Inguinal Hernia     Review of Systems (Philip A. Brown [name]; 01/16/2018 8:41 AM) General Present- Fatigue. Not Present- Appetite Loss, Chills, Fever, Night Sweats, Weight Gain and Weight Loss. Skin Present- Dryness. Not Present- Change in Wart/Mole, Hives, Jaundice, New Lesions, Non-Healing Wounds, Rash and Ulcer. HEENT Present- Hearing Loss, Ringing in the Ears, Seasonal Allergies and Wears glasses/contact lenses. Not Present- Earache, Hoarseness, Nose Bleed, Oral Ulcers, Sinus Pain, Sore Throat, Visual Disturbances and Yellow Eyes. Respiratory Not Present- Bloody sputum, Chronic Cough, Difficulty Breathing, Snoring and Wheezing. Breast Not Present- Breast Mass, Breast Pain, Nipple Discharge and Skin Changes. Cardiovascular Present- Leg Cramps. Not Present- Chest Pain, Difficulty Breathing Lying Down, Palpitations, Rapid Heart Rate, Shortness of Breath and Swelling of Extremities. Gastrointestinal Not Present- Abdominal Pain, Bloating, Bloody Stool, Change in Bowel Habits, Chronic diarrhea, Constipation, Difficulty Swallowing, Excessive gas, Gets full quickly at meals, Hemorrhoids, Indigestion, Nausea, Rectal Pain and Vomiting. Male Genitourinary Not Present- Blood in Urine, Change in Urinary Stream, Frequency, Impotence, Nocturia, Painful Urination, Urgency and Urine Leakage. Musculoskeletal Not Present- Back Pain, Joint Pain, Joint Stiffness, Muscle Pain, Muscle Weakness and Swelling of Extremities. Neurological Not Present- Decreased Memory, Fainting, Headaches, Numbness, Seizures, Tingling, Tremor, Trouble walking and Weakness. Psychiatric Present- Anxiety and Fearful. Not Present- Bipolar, Change in Sleep Pattern, Depression and Frequent crying. Hematology Not Present- Blood Thinners, Easy Bruising, Excessive bleeding, Gland problems, HIV and Persistent Infections.  Vitals (Philip A. Brown [name]; 01/16/2018 8:42 AM) 01/16/2018 8:41  AM Weight: 140.8 lb Height: 64in Body Surface Area: 1.68 m Body Mass Index: 24.17 kg/m  Temp.: 97.61F  Pulse: 83 (Regular)  BP: 134/82 (Sitting, Left Arm, Standard) BP today 151/76 P today 42 Patient has just received TAP block.   Physical Exam Philip Rinks O.  Philip Skains MD; 01/16/2018 9:06 AM) General Mental Status-Alert. General Appearance-Cooperative and Well groomed. Orientation-Oriented X4. Build & Nutrition-Lean.  Chest and Lung Exam Chest and lung exam reveals -normal excursion with symmetric chest walls, quiet, even and easy respiratory effort with no use of accessory muscles, non-tender and normal tactile fremitus and on auscultation, normal breath sounds, no adventitious sounds and normal vocal resonance.  Cardiovascular Cardiovascular examination reveals -on palpation PMI is normal in location and amplitude, no palpable S3 or S4. Normal cardiac borders., normal heart sounds, regular rate and rhythm with no murmurs and femoral artery auscultation bilaterally reveals normal pulses, no bruits, no thrills.  Abdomen Inspection Hernias - Inguinal hernia - Left - Reducible(Previous open right inguinal hernia repair. No recurrence). Confirmed on 7/19 and marked patient appropriately.  Assessment & Plan Philip Rinks O. Wai Litt MD; 01/16/2018 9:10 AM) Paln confirmed 7/19 LEFT INGUINAL HERNIA (K40.90) Story: Patient has had hernia for 10+ year. Reducible, but more symptomatic currently Impression: Reducible, palpable LIH, likely direct. For repair as soon as he can get scheduled. Current Plans:  I have reexamined the patient and their are no significant changes.  Plan for Open repair with mesh.   Philip Johnson. Philip Bailiff, MD, [place] [phone] [facility]

## 2018-04-06 NOTE — Discharge Instructions (Addendum)
Open Hernia Repair, Adult, Care After These instructions give you information about caring for yourself after your procedure. Your doctor may also give you more specific instructions. If you have problems or questions, contact your doctor. Follow these instructions at home: Surgical cut (incision) care   Follow instructions from your doctor about how to take care of your surgical cut area. Make sure you: ? Wash your hands with soap and water before you change your bandage (dressing). If you cannot use soap and water, use hand sanitizer. ? Change your bandage as told by your doctor. ? Leave stitches (sutures), skin glue, or skin tape (adhesive) strips in place. They may need to stay in place for 2 weeks or longer. If tape strips get loose and curl up, you may trim the loose edges. Do not remove tape strips completely unless your doctor says it is okay.  Check your surgical cut every day for signs of infection. Check for: ? More redness, swelling, or pain. ? More fluid or blood. ? Warmth. ? Pus or a bad smell. Activity  Do not drive or use heavy machinery while taking prescription pain medicine. Do not drive until your doctor says it is okay.  Until your doctor says it is okay: ? Do not lift anything that is heavier than 10 lb (4.5 kg). ? Do not play contact sports.  Return to your normal activities as told by your doctor. Ask your doctor what activities are safe. General instructions  To prevent or treat having a hard time pooping (constipation) while you are taking prescription pain medicine, your doctor may recommend that you: ? Drink enough fluid to keep your pee (urine) clear or pale yellow. ? Take over-the-counter or prescription medicines. ? Eat foods that are high in fiber, such as fresh fruits and vegetables, whole grains, and beans. ? Limit foods that are high in fat and processed sugars, such as fried and sweet foods.  Take over-the-counter and prescription medicines only as  told by your doctor.  Do not take baths, swim, or use a hot tub until your doctor says it is okay.  Keep all follow-up visits as told by your doctor. This is important.  Leave dressingintact until seen in clinic Contact a doctor if:  You develop a rash.  You have more redness, swelling, or pain around your surgical cut.  You have more fluid or blood coming from your surgical cut.  Your surgical cut feels warm to the touch.  You have pus or a bad smell coming from your surgical cut.  You have a fever or chills.  You have blood in your poop (stool).  You have not pooped in 2-3 days.  Medicine does not help your pain. Get help right away if:  You have chest pain or you are short of breath.  You feel light-headed.  You feel weak and dizzy (feel faint).  You have very bad pain.  You throw up (vomit) and your pain is worse. This information is not intended to replace advice given to you by your health care provider. Make sure you discuss any questions you have with your health care provider.  [name]. [name], MD, [place] [phone] [facility]   General Anesthesia, Adult, Care After These instructions provide you with information about caring for yourself after your procedure. Your health care provider may also give you more specific instructions. Your treatment has been planned according to current medical practices, but problems sometimes occur. Call your health  care provider if you have any problems or questions after your procedure. What can I expect after the procedure? After the procedure, it is common to have:  Vomiting.  A sore throat.  Mental slowness.  It is common to feel:  Nauseous.  Cold or shivery.  Sleepy.  Tired.  Sore or achy, even in parts of your body where you did not have surgery.  Follow these instructions at home: For at least 24 hours after the procedure:  Do not: ? Participate in  activities where you could fall or become injured. ? Drive. ? Use heavy machinery. ? Drink alcohol. ? Take sleeping pills or medicines that cause drowsiness. ? Make important decisions or sign legal documents. ? Take care of children on your own.  Rest. Eating and drinking  If you vomit, drink water, juice, or soup when you can drink without vomiting.  Drink enough fluid to keep your urine clear or pale yellow.  Make sure you have little or no nausea before eating solid foods.  Follow the diet recommended by your health care provider. General instructions  Have a responsible adult stay with you until you are awake and alert.  Return to your normal activities as told by your health care provider. Ask your health care provider what activities are safe for you.  Take over-the-counter and prescription medicines only as told by your health care provider.  If you smoke, do not smoke without supervision.  Keep all follow-up visits as told by your health care provider. This is important. Contact a health care provider if:  You continue to have nausea or vomiting at home, and medicines are not helpful.  You cannot drink fluids or start eating again.  You cannot urinate after 8-12 hours.  You develop a skin rash.  You have fever.  You have increasing redness at the site of your procedure. Get help right away if:  You have difficulty breathing.  You have chest pain.  You have unexpected bleeding.  You feel that you are having a life-threatening or urgent problem. This information is not intended to replace advice given to you by your health care provider. Make sure you discuss any questions you have with your health care provider. Document Released: 12/12/2000 Document Revised: 02/08/2016 Document Reviewed: 08/20/2015 Elsevier Interactive Patient Education  [employer].

## 2018-04-06 NOTE — Op Note (Signed)
OPERATIVE REPORT  DATE OF OPERATION: 04/06/2018  PATIENT:  Loanne Drilling  62 y.o. male  PRE-OPERATIVE DIAGNOSIS:  Symptomatic Left Inguinal Hernia  POST-OPERATIVE DIAGNOSIS:  Symptomatic Left Inguinal Hernia, primarily direct  INDICATION(S) FOR OPERATION:  Symptomatic left inguinal hernia  FINDINGS:  Large direct hernia with small indirect sac  PROCEDURE:  Procedure(s): OPEN LEFT INGUINAL HERNIA REPAIR ERAS PATHWAY INSERTION OF MESH  SURGEON:  Surgeon(s): Judeth Horn, MD  ASSISTANT: None  ANESTHESIA:   general and LMA and TAP block  COMPLICATIONS:  None  EBL: !0 ml  BLOOD ADMINISTERED: none  DRAINS: none   SPECIMEN:  No Specimen  COUNTS CORRECT:  YES  PROCEDURE DETAILS: The patient was taken to the operating room and placed on the table in the supine position.  He had had a prior tap block placed for repair of the left inguinal hernia.  A general laryngeal airway anesthetic was administered and he was subsequently prepped and draped in the usual sterile manner exposing his left inguinal area.  A proper timeout was performed identifying the patient and the procedure to be performed.  We made a left transverse curvilinear incision approximately 5-1/2 cm long took down into the subcutaneous tissue.  We dissected down to the fascia the external oblique and subsequently identified the superficial inguinal ring.  We made an incision along the external oblique fibers down through the superficial ring exposing the spermatic cord.  We mobilized the spermatic cord at the pubic tubercle used a Penrose drain in order to control it as we dissected out on the cord for a an indirect sac.  We are able to find in the anterior medial aspect the very small indirect sac which we ligated at its base with 2 suture ligatures of 0 Ethibond suture.  The bulk of the patient's primary hernia was a direct sac which we imbricated on itself with 0 Ethibond suture x3.  We then inserted an oval piece of  cut and soaked in antibiotic solution polypropylene mesh starting at the pubic tubercle and coming out laterally to the deep inguinal ring where the spermatic cord came out.  It was attached to the reflected portion of the inguinal ligament and the conjoined tendon anterior medially.  It was sutured together in place with a running 0 Prolene suture.  Once the mesh was in place we irrigated with antibiotic solution and closed the external oblique fascia on top of the cord using running 3-0 Vicryl suture.  We then reapproximated Scarpa's fascia using 3-0 Vicryl sutures interrupted sutures.  We injected 10 mL of 0.5% Marcaine with epinephrine into the subcutaneous tissue then closed the skin using running subcuticular stitch of 4-0 Monocryl.  Dermabond, Tegaderm, and Steri-Strips were used to complete the dressing.  All needle counts, sponge counts, and instrument counts were correct.  PATIENT DISPOSITION:  PACU - hemodynamically stable.   Judeth Horn 7/19/20198:52 AM

## 2018-04-06 NOTE — Anesthesia Procedure Notes (Signed)
Procedure Name: LMA Insertion Performed by: Verita Lamb, CRNA Pre-anesthesia Checklist: Patient identified, Emergency Drugs available, Suction available, Patient being monitored and Timeout performed Patient Re-evaluated:Patient Re-evaluated prior to induction Oxygen Delivery Method: Circle system utilized Preoxygenation: Pre-oxygenation with 100% oxygen Induction Type: IV induction Ventilation: Mask ventilation without difficulty LMA: LMA inserted LMA Size: 4.0 Tube type: Oral Placement Confirmation: CO2 detector,  positive ETCO2 and breath sounds checked- equal and bilateral Tube secured with: Tape Dental Injury: Teeth and Oropharynx as per pre-operative assessment

## 2018-04-09 ENCOUNTER — Encounter (HOSPITAL_BASED_OUTPATIENT_CLINIC_OR_DEPARTMENT_OTHER): Payer: Self-pay | Admitting: General Surgery

## 2018-04-09 NOTE — Addendum Note (Signed)
Addendum  created 04/09/18 1315 by Tawni Millers, CRNA   Charge Capture section accepted

## 2020-01-13 ENCOUNTER — Ambulatory Visit: Payer: PPO | Admitting: Gastroenterology

## 2020-01-13 ENCOUNTER — Encounter: Payer: Self-pay | Admitting: Gastroenterology

## 2020-01-13 ENCOUNTER — Other Ambulatory Visit: Payer: Self-pay

## 2020-01-13 VITALS — BP 150/84 | HR 54 | Temp 97.0°F | Ht 64.0 in | Wt 143.4 lb

## 2020-01-13 DIAGNOSIS — Z1211 Encounter for screening for malignant neoplasm of colon: Secondary | ICD-10-CM

## 2020-01-13 NOTE — Patient Instructions (Signed)
1. Complete Cologuard testing. We will contact you with results as available.

## 2020-01-13 NOTE — Progress Notes (Signed)
      Primary Care Physician: Lemmie Evens, MD  Primary Gastroenterologist:  Garfield Cornea, MD   Chief Complaint  Patient presents with  . Follow-up    here to discuss cologuard testing. last done 2017 and received letter he is due.    HPI: Philip Johnson is a 64 y.o. male here to discuss colon cancer screening.  He was seen in 2017.  At that time he was not interested in colonoscopy and he elected to pursue Cologuard testing which was negative.  No family history of colon cancer or personal history of colon polyps.  Retirement four months ago.  Has gained about 5 pounds.  Otherwise he feels good.  No abdominal pain.  No bowel concerns.  Bowel movements are regular, daily.  No blood in stool or melena.  No upper GI symptoms.  No change in family history, no family history of colon cancer.  Current Outpatient Medications  Medication Sig Dispense Refill  . aspirin EC 81 MG tablet Take 81 mg by mouth as needed.    Marland Kitchen atenolol (TENORMIN) 25 MG tablet 25 mg daily.     . chloridazePOXIDE-amitriptyline (LIMBITROL) 5-12.5 MG tablet Take 1 tablet by mouth daily.    . diazepam (VALIUM) 5 MG tablet Take 5 mg by mouth every 6 (six) hours as needed.     . diphenhydrAMINE (BENADRYL) 25 mg capsule Take 25 mg by mouth every 6 (six) hours as needed.    Marland Kitchen EPIPEN 2-PAK 0.3 MG/0.3ML SOAJ injection 0.3 mg once.     . fexofenadine-pseudoephedrine (ALLEGRA-D 24) 180-240 MG 24 hr tablet Take 1 tablet by mouth daily. In feb, march April months only    . fluticasone (FLONASE) 50 MCG/ACT nasal spray Place 2 sprays into both nostrils daily.     . Multiple Vitamin (MULTIVITAMIN) tablet Take 1 tablet by mouth daily.     No current facility-administered medications for this visit.    Allergies as of 01/13/2020 - Review Complete 01/13/2020  Allergen Reaction Noted  . Bee venom Anaphylaxis 07/08/2016    ROS:  General: Negative for anorexia, weight loss, fever, chills, fatigue, weakness. ENT: Negative for  hoarseness, difficulty swallowing , nasal congestion. CV: Negative for chest pain, angina, palpitations, dyspnea on exertion, peripheral edema.  Respiratory: Negative for dyspnea at rest, dyspnea on exertion, cough, sputum, wheezing.  GI: See history of present illness. GU:  Negative for dysuria, hematuria, urinary incontinence, urinary frequency, nocturnal urination.  Endo: Negative for unusual weight change.    Physical Examination:   BP (!) 150/84   Pulse (!) 54   Temp (!) 97 F (36.1 C) (Oral)   Ht 5\' 4"  (1.626 m)   Wt 143 lb 6.4 oz (65 kg)   BMI 24.61 kg/m   General: Well-nourished, well-developed in no acute distress.  Eyes: No icterus. Mouth: masked Lungs: Clear to auscultation bilaterally.  Heart: Regular rate and rhythm, no murmurs rubs or gallops.  Abdomen: Bowel sounds are normal, nontender, nondistended, no hepatosplenomegaly or masses, no abdominal bruits or hernia , no rebound or guarding.   Extremities: No lower extremity edema. No clubbing or deformities. Neuro: Alert and oriented x 4   Skin: Warm and dry, no jaundice.   Psych: Alert and cooperative, normal mood and affect.   Imaging Studies: No results found.

## 2020-01-13 NOTE — Assessment & Plan Note (Signed)
Colon cancer screening via Cologuard per patient's request.  Patient understands that this is a screening tool and that if he has a positive Cologuard test that he would need a colonoscopy for further evaluation.  Patient in agreement.

## 2020-02-24 DIAGNOSIS — Z1212 Encounter for screening for malignant neoplasm of rectum: Secondary | ICD-10-CM | POA: Diagnosis not present

## 2020-02-24 DIAGNOSIS — Z1211 Encounter for screening for malignant neoplasm of colon: Secondary | ICD-10-CM | POA: Diagnosis not present

## 2020-02-25 LAB — COLOGUARD

## 2020-03-06 DIAGNOSIS — Z0000 Encounter for general adult medical examination without abnormal findings: Secondary | ICD-10-CM | POA: Diagnosis not present

## 2020-03-06 DIAGNOSIS — F419 Anxiety disorder, unspecified: Secondary | ICD-10-CM | POA: Diagnosis not present

## 2020-03-06 DIAGNOSIS — I10 Essential (primary) hypertension: Secondary | ICD-10-CM | POA: Diagnosis not present

## 2020-03-06 DIAGNOSIS — E782 Mixed hyperlipidemia: Secondary | ICD-10-CM | POA: Diagnosis not present

## 2020-03-09 DIAGNOSIS — F3342 Major depressive disorder, recurrent, in full remission: Secondary | ICD-10-CM | POA: Diagnosis not present

## 2020-03-09 DIAGNOSIS — E782 Mixed hyperlipidemia: Secondary | ICD-10-CM | POA: Diagnosis not present

## 2020-03-09 DIAGNOSIS — Z125 Encounter for screening for malignant neoplasm of prostate: Secondary | ICD-10-CM | POA: Diagnosis not present

## 2020-03-09 DIAGNOSIS — Z79899 Other long term (current) drug therapy: Secondary | ICD-10-CM | POA: Diagnosis not present

## 2020-03-09 DIAGNOSIS — I10 Essential (primary) hypertension: Secondary | ICD-10-CM | POA: Diagnosis not present

## 2020-03-09 DIAGNOSIS — F419 Anxiety disorder, unspecified: Secondary | ICD-10-CM | POA: Diagnosis not present

## 2020-03-17 ENCOUNTER — Telehealth: Payer: Self-pay | Admitting: Internal Medicine

## 2020-03-17 NOTE — Telephone Encounter (Signed)
Spoke with pt. I called [facility] and pts cologuard was negative. [facility] is faxing the results over. Results will be placed in LSL box when received.

## 2020-03-17 NOTE — Telephone Encounter (Signed)
[phone]  Patient called and said his cologuard results were in and wanted to know if he was supposed to get the results from here

## 2020-03-19 NOTE — Telephone Encounter (Signed)
Pt is aware that results are negative. Further recommendations when LSL returns.

## 2020-03-24 NOTE — Telephone Encounter (Signed)
Noted. Left a detailed message with results and when the next cologuard or TCS is due 02/2023.  Philip Johnson Please NIC

## 2020-03-24 NOTE — Telephone Encounter (Signed)
Cologuard negative. Recommend repeat Cologuard or colonoscopy in 02/2023. Please NIC.  Please let pt know.

## 2020-03-25 IMAGING — CR DG CHEST 2V
2 series · 2 of 2 positions shown · non-contrast
Comparison: None.

CLINICAL DATA: Preoperative inguinal hernia surgery.  Hypertension.

EXAM:
CHEST - 2 VIEW

[w chest pa]
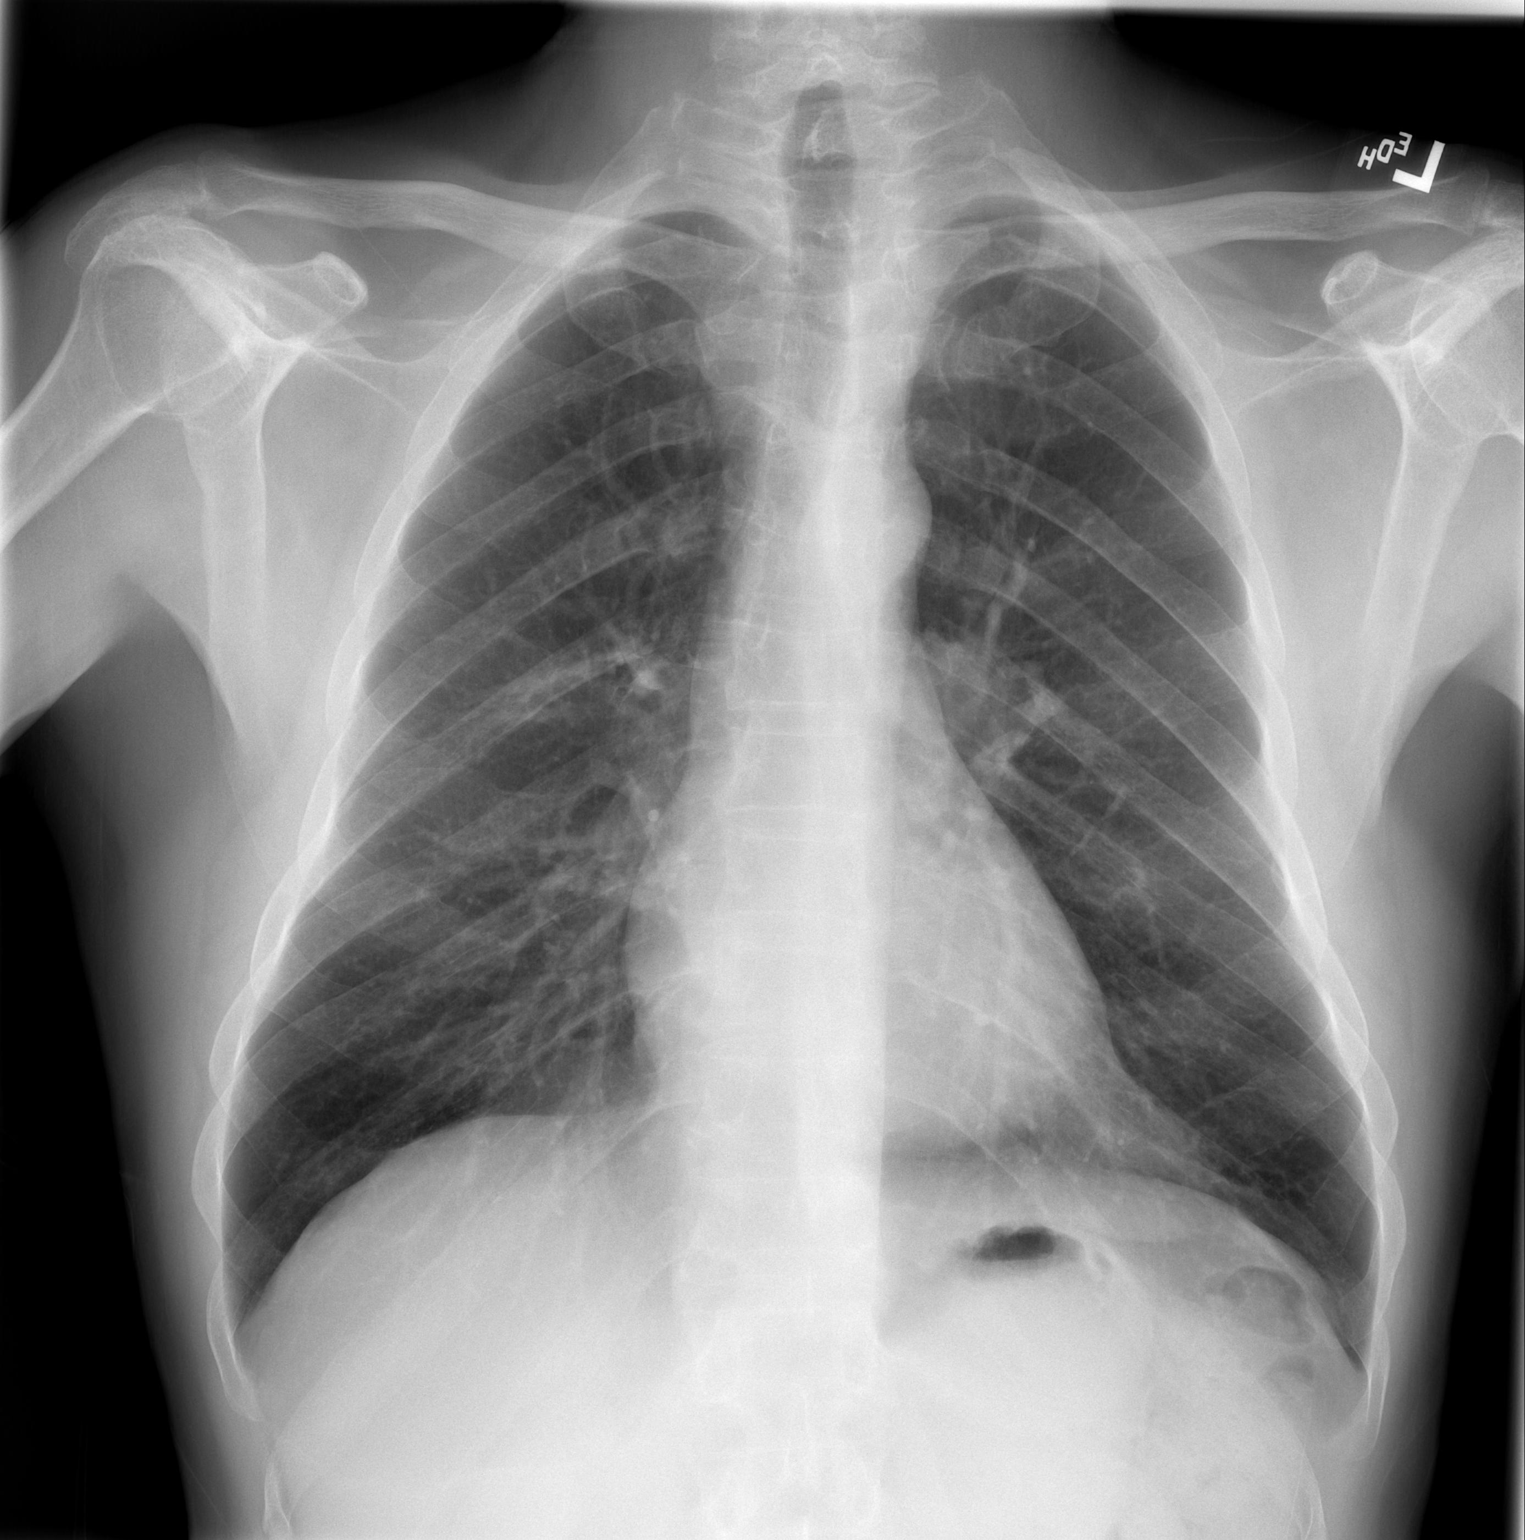

[w chest lat]
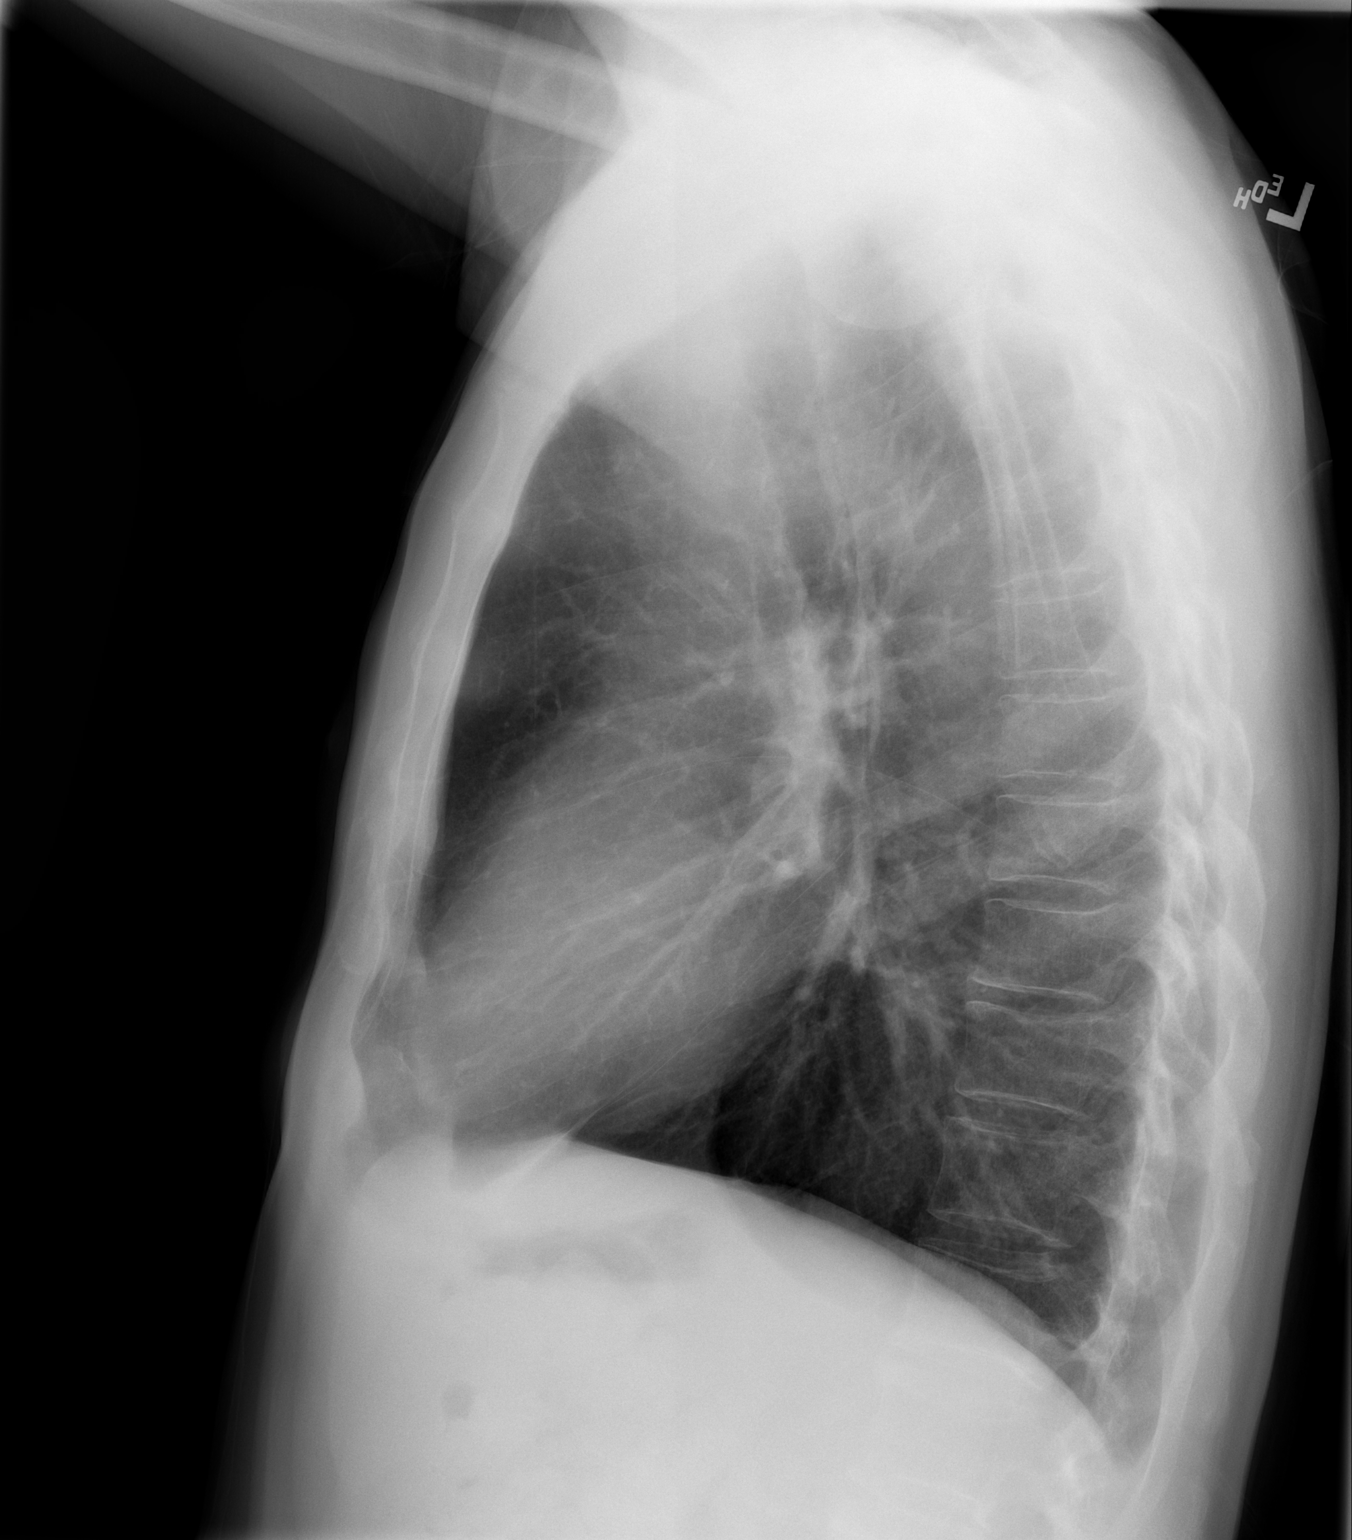

[2 of 2 positions shown; findings below may reference images not displayed]

FINDINGS: Lungs clear. Heart size and pulmonary vascularity normal. No
adenopathy. No bone lesions.
IMPRESSION: No edema or consolidation.

## 2020-03-26 NOTE — Telephone Encounter (Signed)
Reminder in epic °

## 2020-07-13 ENCOUNTER — Ambulatory Visit: Payer: PPO | Admitting: Dermatology

## 2020-07-13 ENCOUNTER — Other Ambulatory Visit: Payer: Self-pay

## 2020-07-13 DIAGNOSIS — Z872 Personal history of diseases of the skin and subcutaneous tissue: Secondary | ICD-10-CM

## 2020-07-13 DIAGNOSIS — L821 Other seborrheic keratosis: Secondary | ICD-10-CM

## 2020-07-13 DIAGNOSIS — Z1283 Encounter for screening for malignant neoplasm of skin: Secondary | ICD-10-CM | POA: Diagnosis not present

## 2020-07-13 DIAGNOSIS — L814 Other melanin hyperpigmentation: Secondary | ICD-10-CM

## 2020-07-13 DIAGNOSIS — D1800 Hemangioma unspecified site: Secondary | ICD-10-CM

## 2020-07-13 DIAGNOSIS — D229 Melanocytic nevi, unspecified: Secondary | ICD-10-CM

## 2020-07-13 DIAGNOSIS — L739 Follicular disorder, unspecified: Secondary | ICD-10-CM | POA: Diagnosis not present

## 2020-07-13 DIAGNOSIS — L578 Other skin changes due to chronic exposure to nonionizing radiation: Secondary | ICD-10-CM

## 2020-07-13 DIAGNOSIS — L570 Actinic keratosis: Secondary | ICD-10-CM

## 2020-07-13 MED ORDER — CLINDAMYCIN PHOSPHATE 1 % EX SOLN: CUTANEOUS | 3 refills | Status: AC

## 2020-07-13 NOTE — Patient Instructions (Addendum)
Seborrheic Keratosis  What causes seborrheic keratoses? Seborrheic keratoses are harmless, common skin growths that first appear during adult life.  As time goes by, more growths appear.  Some people may develop a large number of them.  Seborrheic keratoses appear on both covered and uncovered body parts.  They are not caused by sunlight.  The tendency to develop seborrheic keratoses can be inherited.  They vary in color from skin-colored to gray, brown, or even black.  They can be either smooth or have a rough, warty surface.   Seborrheic keratoses are superficial and look as if they were stuck on the skin.  Under the microscope this type of keratosis looks like layers upon layers of skin.  That is why at times the top layer may seem to fall off, but the rest of the growth remains and re-grows.    Treatment Seborrheic keratoses do not need to be treated, but can easily be removed in the office.  Seborrheic keratoses often cause symptoms when they rub on clothing or jewelry.  Lesions can be in the way of shaving.  If they become inflamed, they can cause itching, soreness, or burning.  Removal of a seborrheic keratosis can be accomplished by freezing, burning, or surgery. If any spot bleeds, scabs, or grows rapidly, please return to have it checked, as these can be an indication of a skin cancer.   Start PanOxyl Wash (4 or 10%) Use in shower, let sit a few minutes before rinsing. Risk of bleaching towels/clothing if not completely rinsed off.  Clindamycin solution - Apply to abdomen once a day after shower as needed for breakouts.  Cryotherapy Aftercare  . Wash gently with soap and water everyday.   Marland Kitchen Apply Vaseline and Band-Aid daily until healed.

## 2020-07-13 NOTE — Progress Notes (Signed)
   New Patient Visit  Subjective  Philip Johnson is a 64 y.o. male who presents for the following: UBSE and Spots (forehead, back). New but not bothersome He also has a history of rash around waistline. Spots come up as a hard, red area and turns into a blister/pus bump, then bursts and turns into a scab, somewhat tender, don't itch. Bumps come off and on over past several years. No history of skin cancer.  The following portions of the chart were reviewed this encounter and updated as appropriate:      Review of Systems:  No other skin or systemic complaints except as noted in HPI or Assessment and Plan.  Objective  Well appearing patient in no apparent distress; mood and affect are within normal limits.  All skin waist up examined.  Objective  Left Upper Temple x 2 (2): Pink/brown scaly macule   Objective  Abdomen: Excoriated pink papule on left abdomen.   Assessment & Plan   Skin cancer screening performed today.  Actinic Damage - diffuse scaly erythematous macules with underlying dyspigmentation - Recommend daily broad spectrum sunscreen SPF 30+ to sun-exposed areas, reapply every 2 hours as needed.  - Call for new or changing lesions.  Seborrheic Keratoses - Stuck-on, waxy, tan-brown papules and plaques, including left forehead, R post shoulder  - Discussed benign etiology and prognosis. - Observe - Call for any changes  Hemangiomas - Red papules - Discussed benign nature - Observe - Call for any changes  Melanocytic Nevi - Tan-brown and/or pink-flesh-colored symmetric macules and papules - Benign appearing on exam today - Observation - Call clinic for new or changing moles - Recommend daily use of broad spectrum spf 30+ sunscreen to sun-exposed areas.   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  Start Clindamycin solution Apply to AA qd after shower prn flares dsp 41mL 3Rf.  Start PanOxyl 4% Creamy Wash in shower, let sit a few minutes before rinsing. Risk bleaching.  clindamycin (CLEOCIN T) 1 % external solution - Abdomen  Return if symptoms worsen or fail to improve.   Documentation: I have reviewed the above documentation for accuracy and completeness, and I agree with the above.  Brendolyn Patty MD

## 2020-08-18 DIAGNOSIS — H02402 Unspecified ptosis of left eyelid: Secondary | ICD-10-CM | POA: Diagnosis not present

## 2020-08-18 DIAGNOSIS — H2513 Age-related nuclear cataract, bilateral: Secondary | ICD-10-CM | POA: Diagnosis not present

## 2021-03-10 DIAGNOSIS — G4700 Insomnia, unspecified: Secondary | ICD-10-CM | POA: Diagnosis not present

## 2021-03-10 DIAGNOSIS — E782 Mixed hyperlipidemia: Secondary | ICD-10-CM | POA: Diagnosis not present

## 2021-03-10 DIAGNOSIS — Z0000 Encounter for general adult medical examination without abnormal findings: Secondary | ICD-10-CM | POA: Diagnosis not present

## 2021-03-10 DIAGNOSIS — F419 Anxiety disorder, unspecified: Secondary | ICD-10-CM | POA: Diagnosis not present

## 2021-03-10 DIAGNOSIS — Z125 Encounter for screening for malignant neoplasm of prostate: Secondary | ICD-10-CM | POA: Diagnosis not present

## 2021-03-10 DIAGNOSIS — I10 Essential (primary) hypertension: Secondary | ICD-10-CM | POA: Diagnosis not present

## 2021-08-30 DIAGNOSIS — H02402 Unspecified ptosis of left eyelid: Secondary | ICD-10-CM | POA: Diagnosis not present

## 2021-08-30 DIAGNOSIS — H43393 Other vitreous opacities, bilateral: Secondary | ICD-10-CM | POA: Diagnosis not present

## 2021-08-30 DIAGNOSIS — H2513 Age-related nuclear cataract, bilateral: Secondary | ICD-10-CM | POA: Diagnosis not present

## 2022-02-09 ENCOUNTER — Ambulatory Visit: Payer: PPO | Admitting: Dermatology

## 2022-02-09 ENCOUNTER — Encounter: Payer: Self-pay | Admitting: Dermatology

## 2022-02-09 DIAGNOSIS — L300 Nummular dermatitis: Secondary | ICD-10-CM | POA: Diagnosis not present

## 2022-02-09 DIAGNOSIS — D229 Melanocytic nevi, unspecified: Secondary | ICD-10-CM

## 2022-02-09 DIAGNOSIS — Z1283 Encounter for screening for malignant neoplasm of skin: Secondary | ICD-10-CM | POA: Diagnosis not present

## 2022-02-09 DIAGNOSIS — D1800 Hemangioma unspecified site: Secondary | ICD-10-CM

## 2022-02-09 DIAGNOSIS — L814 Other melanin hyperpigmentation: Secondary | ICD-10-CM

## 2022-02-09 DIAGNOSIS — D485 Neoplasm of uncertain behavior of skin: Secondary | ICD-10-CM | POA: Diagnosis not present

## 2022-02-09 DIAGNOSIS — L578 Other skin changes due to chronic exposure to nonionizing radiation: Secondary | ICD-10-CM

## 2022-02-09 DIAGNOSIS — L821 Other seborrheic keratosis: Secondary | ICD-10-CM

## 2022-02-09 MED ORDER — KETOCONAZOLE 2 % EX CREA: TOPICAL_CREAM | CUTANEOUS | 0 refills | Status: DC

## 2022-02-09 NOTE — Progress Notes (Signed)
Follow-Up Visit   Subjective  Philip Johnson is a 66 y.o. male who presents for the following: Annual Exam.  The patient presents for Upper Body Skin Exam (UBSE) for skin cancer screening and mole check.  The patient has spots, moles and lesions to be evaluated, some may be new or changing. He has a spot on the left forehead and the right shoulder, both are more raised and darker. He has a history of AKs. He also has a history of bumps of the abdomen (previous dx of folliculitis), with flares every 4-6 weeks. Areas not itchy or painful. He tried clindamycin solution in the past with no improvement. Family history of melanoma in father.    The following portions of the chart were reviewed this encounter and updated as appropriate:       Review of Systems:  No other skin or systemic complaints except as noted in HPI or Assessment and Plan.  Objective  Well appearing patient in no apparent distress; mood and affect are within normal limits.  All skin waist up examined.  Right Temple 5.0 mm speckled brown macule       lower abdomen, lower flank, inguinal crease, upper medial thighs Pink scaly papules/patches     Assessment & Plan  Skin cancer screening performed today.  Actinic Damage - chronic, secondary to cumulative UV radiation exposure/sun exposure over time - diffuse scaly erythematous macules with underlying dyspigmentation - Recommend daily broad spectrum sunscreen SPF 30+ to sun-exposed areas, reapply every 2 hours as needed.  - Recommend staying in the shade or wearing long sleeves, sun glasses (UVA+UVB protection) and wide brim hats (4-inch brim around the entire circumference of the hat). - Call for new or changing lesions.  Lentigines - Scattered tan macules - Due to sun exposure - Benign-appering, observe - Recommend daily broad spectrum sunscreen SPF 30+ to sun-exposed areas, reapply every 2 hours as needed. - Call for any changes  Seborrheic  Keratoses - Stuck-on, waxy, tan-brown papules and/or plaques, including left forehead, right shoulder  - Benign-appearing - Discussed benign etiology and prognosis. - Observe - Call for any changes  Hemangiomas - Red papules - Discussed benign nature - Observe - Call for any changes  Melanocytic Nevi - Tan-brown and/or pink-flesh-colored symmetric macules and papules - Benign appearing on exam today - Observation - Call clinic for new or changing moles - Recommend daily use of broad spectrum spf 30+ sunscreen to sun-exposed areas.   Neoplasm of uncertain behavior of skin Right Temple  Epidermal / dermal shaving  Lesion diameter (cm):  0.6 Informed consent: discussed and consent obtained   Patient was prepped and draped in usual sterile fashion: Area prepped with alcohol. Anesthesia: the lesion was anesthetized in a standard fashion   Anesthetic:  1% lidocaine w/ epinephrine 1-100,000 buffered w/ 8.4% NaHCO3 Instrument used: flexible razor blade   Hemostasis achieved with: pressure, aluminum chloride and electrodesiccation   Outcome: patient tolerated procedure well   Post-procedure details: wound care instructions given   Post-procedure details comment:  Ointment and small bandage applied  Specimen 1 - Surgical pathology Differential Diagnosis: Lentigo vs SK r/o Atypia Check Margins: Yes 5.0 mm speckled brown macule    Nummular dermatitis lower abdomen, lower flank, inguinal crease, upper medial thighs  Vs Tinea/Candida  Recommend mild soap and moisturizing cream 1-2 times daily.  Gentle skin care handout provided.   Start ketoconazole 2% cream Apply to AA rash on abdomen, groin bid x 2-4 weeks dsp 60g  0Rf.  If not improving, patient will call and switch to hydrocortisone 2.5% cream.   ketoconazole (NIZORAL) 2 % cream - lower abdomen, lower flank, inguinal crease, upper medial thighs Apply to affected areas rash on abdomen and groin twice daily for 2-4  weeks.   Return in about 1 year (around 02/10/2023) for UBSE, Hx AKs.  IJamesetta Orleans, CMA, am acting as scribe for Brendolyn Patty, MD . Documentation: I have reviewed the above documentation for accuracy and completeness, and I agree with the above.  Brendolyn Patty MD

## 2022-02-09 NOTE — Patient Instructions (Addendum)
Ketoconazole 2% Cream - apply twice daily to rash on abdomen and groin for 2 weeks. If improving, continue for at least 4 weeks. If not improving, call office to switch to a different cream.    Wound Care Instructions  Cleanse wound gently with soap and water once a day then pat dry with clean gauze. Apply a thing coat of Petrolatum (petroleum jelly, "Vaseline") over the wound (unless you have an allergy to this). We recommend that you use a new, sterile tube of Vaseline. Do not pick or remove scabs. Do not remove the yellow or white "healing tissue" from the base of the wound.  Cover the wound with fresh, clean, nonstick gauze and secure with paper tape. You may use Band-Aids in place of gauze and tape if the would is small enough, but would recommend trimming much of the tape off as there is often too much. Sometimes Band-Aids can irritate the skin.  You should call the office for your biopsy report after 1 week if you have not already been contacted.  If you experience any problems, such as abnormal amounts of bleeding, swelling, significant bruising, significant pain, or evidence of infection, please call the office immediately.  FOR ADULT SURGERY PATIENTS: If you need something for pain relief you may take 1 extra strength Tylenol (acetaminophen) AND 2 Ibuprofen ('200mg'$  each) together every 4 hours as needed for pain. (do not take these if you are allergic to them or if you have a reason you should not take them.) Typically, you may only need pain medication for 1 to 3 days.      Seborrheic Keratosis  What causes seborrheic keratoses? Seborrheic keratoses are harmless, common skin growths that first appear during adult life.  As time goes by, more growths appear.  Some people may develop a large number of them.  Seborrheic keratoses appear on both covered and uncovered body parts.  They are not caused by sunlight.  The tendency to develop seborrheic keratoses can be inherited.  They vary in  color from skin-colored to gray, brown, or even black.  They can be either smooth or have a rough, warty surface.   Seborrheic keratoses are superficial and look as if they were stuck on the skin.  Under the microscope this type of keratosis looks like layers upon layers of skin.  That is why at times the top layer may seem to fall off, but the rest of the growth remains and re-grows.    Treatment Seborrheic keratoses do not need to be treated, but can easily be removed in the office.  Seborrheic keratoses often cause symptoms when they rub on clothing or jewelry.  Lesions can be in the way of shaving.  If they become inflamed, they can cause itching, soreness, or burning.  Removal of a seborrheic keratosis can be accomplished by freezing, burning, or surgery. If any spot bleeds, scabs, or grows rapidly, please return to have it checked, as these can be an indication of a skin cancer.   If You Need Anything After Your Visit  If you have any questions or concerns for your doctor, please call our main line at [phone] and press option 4 to reach your doctor's medical assistant. If no one answers, please leave a voicemail as directed and we will return your call as soon as possible. Messages left after 4 pm will be answered the following business day.   You may also send [place] a message via [place]. We typically respond to MyChart  messages within 1-2 business days.  For prescription refills, please ask your pharmacy to contact our office. Our fax number is [phone].  If you have an urgent issue when the clinic is closed that cannot wait until the next business day, you can page your doctor at the number below.    Please note that while we do our best to be available for urgent issues outside of office hours, we are not available 24/7.   If you have an urgent issue and are unable to reach [place], you may choose to seek medical care at your doctor's office, retail clinic, urgent care center, or  emergency room.  If you have a medical emergency, please immediately call 911 or go to the emergency department.  Pager Numbers  - Dr. Nehemiah Massed: [phone]  - Dr. Laurence Ferrari: [phone]  - Dr. Nicole Kindred: [phone]  In the event of inclement weather, please call our main line at [phone] for an update on the status of any delays or closures.  Dermatology Medication Tips: Please keep the boxes that topical medications come in in order to help keep track of the instructions about where and how to use these. Pharmacies typically print the medication instructions only on the boxes and not directly on the medication tubes.   If your medication is too expensive, please contact our office at [phone] option 4 or send [place] a message through [place].   We are unable to tell what your co-pay for medications will be in advance as this is different depending on your insurance coverage. However, we may be able to find a substitute medication at lower cost or fill out paperwork to get insurance to cover a needed medication.   If a prior authorization is required to get your medication covered by your insurance company, please allow [place] 1-2 business days to complete this process.  Drug prices often vary depending on where the prescription is filled and some pharmacies may offer cheaper prices.  The website www.goodrx.com contains coupons for medications through different pharmacies. The prices here do not account for what the cost may be with help from insurance (it may be cheaper with your insurance), but the website can give you the price if you did not use any insurance.  - You can print the associated coupon and take it with your prescription to the pharmacy.  - You may also stop by our office during regular business hours and pick up a GoodRx coupon card.  - If you need your prescription sent electronically to a different pharmacy, notify our office through [facility] or by phone at  [phone] option 4.     Si Usted Necesita Algo Despus de Su Visita  Tambin puede enviarnos un mensaje a travs de Pharmacist, community. Por lo general respondemos a los mensajes de MyChart en el transcurso de 1 a 2 das hbiles.  Para renovar recetas, por favor pida a su farmacia que se ponga en contacto con nuestra oficina. Harland Dingwall de fax es [place] [phone].  Si tiene un asunto urgente cuando la clnica est cerrada y que no puede esperar hasta el siguiente da hbil, puede llamar/localizar a su doctor(a) al nmero que aparece a continuacin.   Por favor, tenga en cuenta que aunque hacemos todo lo posible para estar disponibles para asuntos urgentes fuera del horario de [place], no estamos disponibles las 24 horas del da, los 7 das de la [place].   Si tiene un problema urgente y no puede comunicarse con nosotros, puede optar  por buscar atencin mdica  en el consultorio de su doctor(a), en una clnica privada, en un centro de atencin urgente o en una sala de emergencias.  Si tiene Engineering geologist, por favor llame inmediatamente al 911 o vaya a la sala de emergencias.  Nmeros de bper  - Dr. Nehemiah Massed: [phone]  - Dra. Moye: [phone]  - Dra. Nicole Kindred: [phone]  En caso de inclemencias del [place], por favor llame a Johnsie Kindred principal al [phone] para una actualizacin sobre el [place] de cualquier retraso o cierre.  Consejos para la medicacin en dermatologa: Por favor, guarde las cajas en las que vienen los medicamentos de uso tpico para ayudarle a seguir las instrucciones sobre dnde y cmo usarlos. Las farmacias generalmente imprimen las instrucciones del medicamento slo en las cajas y no directamente en los tubos del [place].   Si su medicamento es muy caro, por favor, pngase en contacto con Zigmund Daniel llamando al [phone] y presione la opcin 4 o envenos un mensaje a travs de Pharmacist, community.   No podemos decirle cul ser su copago por los  medicamentos por adelantado ya que esto es diferente dependiendo de la cobertura de su seguro. Sin embargo, es posible que podamos encontrar un medicamento sustituto a Electrical engineer un formulario para que el seguro cubra el medicamento que se considera necesario.   Si se requiere una autorizacin previa para que su compaa de seguros [place] su medicamento, por favor permtanos de 1 a 2 das hbiles para completar este proceso.  Los precios de los medicamentos varan con frecuencia dependiendo del Environmental consultant de dnde se surte la receta y alguna farmacias pueden ofrecer precios ms baratos.  El sitio web www.goodrx.com tiene cupones para medicamentos de Airline pilot. Los precios aqu no tienen en cuenta lo que podra costar con la ayuda del seguro (puede ser ms barato con su seguro), pero el sitio web puede darle el precio si no utiliz Research scientist (physical sciences).  - Puede imprimir el cupn correspondiente y llevarlo con su receta a la farmacia.  - Tambin puede pasar por nuestra oficina durante el horario de atencin regular y Charity fundraiser una tarjeta de cupones de GoodRx.  - Si necesita que su receta se enve electrnicamente a una farmacia diferente, informe a nuestra oficina a travs de MyChart de [place] o por telfono llamando al [phone] y presione la opcin 4.

## 2022-02-15 ENCOUNTER — Telehealth: Payer: Self-pay

## 2022-02-15 NOTE — Telephone Encounter (Signed)
Advised pt of bx results/sh ?

## 2022-02-15 NOTE — Telephone Encounter (Signed)
-----   Message from Brendolyn Patty, MD sent at 02/15/2022 12:49 PM EDT ----- Skin , right temple PIGMENTED SEBORRHEIC KERATOSIS  Benign SK - please call patient

## 2023-02-09 ENCOUNTER — Telehealth: Payer: Self-pay | Admitting: *Deleted

## 2023-02-09 ENCOUNTER — Other Ambulatory Visit: Payer: Self-pay

## 2023-02-09 DIAGNOSIS — Z1211 Encounter for screening for malignant neoplasm of colon: Secondary | ICD-10-CM

## 2023-02-09 NOTE — Progress Notes (Signed)
Lmom for pt to return my call.  

## 2023-02-09 NOTE — Progress Notes (Signed)
[name], we need to reach out to patient and find out if he wants to complete Cologuard. We have not seen him in three years which makes it a little difficult to order anything without a visit. I would go along with it this time if he desires cologuard but he will require a visit before the next one.

## 2023-02-09 NOTE — Progress Notes (Signed)
It's a different situation for sure. When we have recall imaging, those are usually people we are actively seeing. This patient we only saw before for colon cancer screening and he opted for Cologuard. We have to actively be seeing someone to order tests. Recall for colonoscopies are also different, as they do get seen at time of the colonoscopy.

## 2023-02-09 NOTE — Progress Notes (Signed)
Pt called back and states that he does want to complete the cologuard and was made aware that he will require a visit for the next one. I was unaware on how I was to proceed with this when it was sent to me, but will know moving forward.

## 2023-02-09 NOTE — Telephone Encounter (Signed)
Sent, thank you

## 2023-02-09 NOTE — Telephone Encounter (Signed)
This patient is on recall for 3 yr cologuard - Mindy said to send to you

## 2023-02-20 ENCOUNTER — Encounter: Payer: Self-pay | Admitting: Dermatology

## 2023-02-20 ENCOUNTER — Ambulatory Visit: Payer: PPO | Admitting: Dermatology

## 2023-02-20 VITALS — BP 145/90 | HR 54

## 2023-02-20 DIAGNOSIS — D229 Melanocytic nevi, unspecified: Secondary | ICD-10-CM

## 2023-02-20 DIAGNOSIS — L247 Irritant contact dermatitis due to plants, except food: Secondary | ICD-10-CM | POA: Diagnosis not present

## 2023-02-20 DIAGNOSIS — W908XXA Exposure to other nonionizing radiation, initial encounter: Secondary | ICD-10-CM

## 2023-02-20 DIAGNOSIS — L821 Other seborrheic keratosis: Secondary | ICD-10-CM

## 2023-02-20 DIAGNOSIS — L820 Inflamed seborrheic keratosis: Secondary | ICD-10-CM

## 2023-02-20 DIAGNOSIS — D1801 Hemangioma of skin and subcutaneous tissue: Secondary | ICD-10-CM

## 2023-02-20 DIAGNOSIS — X32XXXA Exposure to sunlight, initial encounter: Secondary | ICD-10-CM

## 2023-02-20 DIAGNOSIS — Z872 Personal history of diseases of the skin and subcutaneous tissue: Secondary | ICD-10-CM

## 2023-02-20 DIAGNOSIS — R21 Rash and other nonspecific skin eruption: Secondary | ICD-10-CM | POA: Diagnosis not present

## 2023-02-20 DIAGNOSIS — L578 Other skin changes due to chronic exposure to nonionizing radiation: Secondary | ICD-10-CM

## 2023-02-20 DIAGNOSIS — Z1283 Encounter for screening for malignant neoplasm of skin: Secondary | ICD-10-CM | POA: Diagnosis not present

## 2023-02-20 MED ORDER — CLOBETASOL PROPIONATE 0.05 % EX CREA: TOPICAL_CREAM | CUTANEOUS | 1 refills | Status: AC

## 2023-02-20 NOTE — Progress Notes (Signed)
Follow-Up Visit   Subjective  Philip Johnson is a 67 y.o. male who presents for the following: Skin Cancer Screening and Full Body Skin Exam hx of aks, hx of nummular dermatitis. Rash in groin area never clears.  Working out in yard and was exposed to poison ivy.  Check spot on forehead.   The patient presents for Total-Body Skin Exam (TBSE) for skin cancer screening and mole check. The patient has spots, moles and lesions to be evaluated, some may be new or changing and the patient has concerns that these could be cancer.    The following portions of the chart were reviewed this encounter and updated as appropriate: medications, allergies, medical history  Review of Systems:  No other skin or systemic complaints except as noted in HPI or Assessment and Plan.  Objective  Well appearing patient in no apparent distress; mood and affect are within normal limits.  A full examination was performed including scalp, head, eyes, ears, nose, lips, neck, chest, axillae, abdomen, back, buttocks, bilateral upper extremities, bilateral lower extremities, hands, feet, fingers, toes, fingernails, and toenails. All findings within normal limits unless otherwise noted below.   Relevant physical exam findings are noted in the Assessment and Plan.  left forehead Erythematous stuck-on, waxy papule    Assessment & Plan   ALLERGIC CONTACT DERMATITIS to Poison Ivy Exam: scaly pink patch R wrist  Treatment Plan: Start Clobetasol Cream  -  apply bid prn for itchy rash until clear. Avoid applying to face, groin, and axilla. Use as directed. Long-term use can cause thinning of the skin. 30 g 1rf   Topical steroids (such as triamcinolone, fluocinolone, fluocinonide, mometasone, clobetasol, halobetasol, betamethasone, hydrocortisone) can cause thinning and lightening of the skin if they are used for too long in the same area. Your physician has selected the right strength medicine for your problem and area  affected on the body. Please use your medication only as directed by your physician to prevent side effects.   Insect Bite Reaction on ankle  Exam: Crusted excoriated papules   Treatment Plan: Can also use Clobetasol Cream to affected areas bid prn itch for bites until resolved. Avoid applying to face, groin, and axilla. Use as directed. Long-term use can cause thinning of the skin.  Topical steroids (such as triamcinolone, fluocinolone, fluocinonide, mometasone, clobetasol, halobetasol, betamethasone, hydrocortisone) can cause thinning and lightening of the skin if they are used for too long in the same area. Your physician has selected the right strength medicine for your problem and area affected on the body. Please use your medication only as directed by your physician to prevent side effects.    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  Chronic and persistent condition with duration or expected duration over one year. Condition is symptomatic/ bothersome to patient. Not currently at goal.   Treatment Plan:  Recommend mild soap and moisturizing cream 1-2 times daily.  Gentle skin care handout provided.    start ketoconazole 2% cream Apply to AA rash on abdomen, groin bid x 2-4 weeks  Discussed adding oral antifungal treatment, pt defers     LENTIGINES, SEBORRHEIC KERATOSES, HEMANGIOMAS - Benign normal skin lesions - Benign-appearing - Call for any changes   MELANOCYTIC NEVI - Tan-brown and/or pink-flesh-colored symmetric macules and papules - Benign appearing on exam today - Observation - Call clinic for new or  changing moles - Recommend daily use of broad spectrum spf 30+ sunscreen to sun-exposed areas.   ACTINIC DAMAGE - Chronic condition, secondary to cumulative UV/sun exposure - diffuse scaly erythematous  macules with underlying dyspigmentation - Recommend daily broad spectrum sunscreen SPF 30+ to sun-exposed areas, reapply every 2 hours as needed.  - Staying in the shade or wearing long sleeves, sun glasses (UVA+UVB protection) and wide brim hats (4-inch brim around the entire circumference of the hat) are also recommended for sun protection.  - Call for new or changing lesions.  SKIN CANCER SCREENING PERFORMED TODAY.  Inflamed seborrheic keratosis left forehead  Irritated by ball cap. Patient defers treatment today   Reassured benign age-related growth.  Recommend observation.  Discussed cryotherapy if spot(s) become irritated or inflamed.   Irritant contact dermatitis due to plants, except food  Related Medications clobetasol cream (TEMOVATE) 0.05 % Apply topically bid to itchy rash prn until clear. Avoid applying to face, groin, and axilla. Use as directed.  Skin cancer screening  Actinic skin damage  Nevus  Rash and nonspecific skin eruption  Seborrheic keratosis   Return in about 1 year (around 02/20/2024) for TBSE.  I, Asher Muir, CMA, am acting as scribe for Willeen Niece, MD.   Documentation: I have reviewed the above documentation for accuracy and completeness, and I agree with the above.  Willeen Niece, MD

## 2023-02-20 NOTE — Patient Instructions (Addendum)
For rash at upper thighs, groin, lower abdomen can continue Ketoconazole cream apply topically  daily 2 - 4 weeks    For bug bites and poison ivy  rash   Can start clobetasol cream - apply twice daily to rash as needed. Avoid applying to face, groin, and axilla. Use as directed. Long-term use can cause thinning of the skin.  Topical steroids (such as triamcinolone, fluocinolone, fluocinonide, mometasone, clobetasol, halobetasol, betamethasone, hydrocortisone) can cause thinning and lightening of the skin if they are used for too long in the same area. Your physician has selected the right strength medicine for your problem and area affected on the body. Please use your medication only as directed by your physician to prevent side effects.       Gentle Skin Care Guide  1. Bathe no more than once a day.  2. Avoid bathing in hot water  3. Use a mild soap like Dove, Vanicream, Cetaphil, CeraVe. Can use Lever 2000 or Cetaphil antibacterial soap  4. Use soap only where you need it. On most days, use it under your arms, between your legs, and on your feet. Let the water rinse other areas unless visibly dirty.  5. When you get out of the bath/shower, use a towel to gently blot your skin dry, don't rub it.  6. While your skin is still a little damp, apply a moisturizing cream such as Vanicream, CeraVe, Cetaphil, Eucerin, Sarna lotion or plain Vaseline Jelly. For hands apply Neutrogena [place] Hand Cream or Excipial Hand Cream.  7. Reapply moisturizer any time you start to itch or feel dry.  8. Sometimes using free and clear laundry detergents can be helpful. Fabric softener sheets should be avoided. Downy Free & Gentle liquid, or any liquid fabric softener that is free of dyes and perfumes, it acceptable to use  9. If your doctor has given you prescription creams you may apply moisturizers over them       Seborrheic Keratosis  What causes seborrheic keratoses? Seborrheic keratoses  are harmless, common skin growths that first appear during adult life.  As time goes by, more growths appear.  Some people may develop a large number of them.  Seborrheic keratoses appear on both covered and uncovered body parts.  They are not caused by sunlight.  The tendency to develop seborrheic keratoses can be inherited.  They vary in color from skin-colored to gray, brown, or even black.  They can be either smooth or have a rough, warty surface.   Seborrheic keratoses are superficial and look as if they were stuck on the skin.  Under the microscope this type of keratosis looks like layers upon layers of skin.  That is why at times the top layer may seem to fall off, but the rest of the growth remains and re-grows.    Treatment Seborrheic keratoses do not need to be treated, but can easily be removed in the office.  Seborrheic keratoses often cause symptoms when they rub on clothing or jewelry.  Lesions can be in the way of shaving.  If they become inflamed, they can cause itching, soreness, or burning.  Removal of a seborrheic keratosis can be accomplished by freezing, burning, or surgery. If any spot bleeds, scabs, or grows rapidly, please return to have it checked, as these can be an indication of a skin cancer.     Melanoma ABCDEs  Melanoma is the most dangerous type of skin cancer, and is the leading cause of death from skin disease.  You are more likely to develop melanoma if you: Have light-colored skin, light-colored eyes, or red or blond hair Spend a lot of time in the sun Tan regularly, either outdoors or in a tanning bed Have had blistering sunburns, especially during childhood Have a close family member who has had a melanoma Have atypical moles or large birthmarks  Early detection of melanoma is key since treatment is typically straightforward and cure rates are extremely high if we catch it early.   The first sign of melanoma is often a change in a mole or a new dark spot.   The ABCDE system is a way of remembering the signs of melanoma.  A for asymmetry:  The two halves do not match. B for border:  The edges of the growth are irregular. C for color:  A mixture of colors are present instead of an even brown color. D for diameter:  Melanomas are usually (but not always) greater than 6mm - the size of a pencil eraser. E for evolution:  The spot keeps changing in size, shape, and color.  Please check your skin once per month between visits. You can use a small mirror in front and a large mirror behind you to keep an eye on the back side or your body.   If you see any new or changing lesions before your next follow-up, please call to schedule a visit.  Please continue daily skin protection including broad spectrum sunscreen SPF 30+ to sun-exposed areas, reapplying every 2 hours as needed when you're outdoors.   Staying in the shade or wearing long sleeves, sun glasses (UVA+UVB protection) and wide brim hats (4-inch brim around the entire circumference of the hat) are also recommended for sun protection.    Due to recent changes in healthcare laws, you may see results of your pathology and/or laboratory studies on MyChart before the doctors have had a chance to review them. We understand that in some cases there may be results that are confusing or concerning to you. Please understand that not all results are received at the same time and often the doctors may need to interpret multiple results in order to provide you with the best plan of care or course of treatment. Therefore, we ask that you please give [place] 2 business days to thoroughly review all your results before contacting the office for clarification. Should we see a critical lab result, you will be contacted sooner.   If You Need Anything After Your Visit  If you have any questions or concerns for your doctor, please call our main line at [phone] and press option 4 to reach your doctor's medical assistant.  If no one answers, please leave a voicemail as directed and we will return your call as soon as possible. Messages left after 4 pm will be answered the following business day.   You may also send [place] a message via MyChart. We typically respond to MyChart messages within 1-2 business days.  For prescription refills, please ask your pharmacy to contact our office. Our fax number is [phone].  If you have an urgent issue when the clinic is closed that cannot wait until the next business day, you can page your doctor at the number below.    Please note that while we do our best to be available for urgent issues outside of office hours, we are not available 24/7.   If you have an urgent issue and are unable to reach [place], you may choose to  seek medical care at your doctor's office, retail clinic, urgent care center, or emergency room.  If you have a medical emergency, please immediately call 911 or go to the emergency department.  Pager Numbers  - Dr. Gwen Pounds: [phone]  - Dr. Neale Burly: [phone]  - Dr. Roseanne Reno: [phone]  In the event of inclement weather, please call our main line at [phone] for an update on the status of any delays or closures.  Dermatology Medication Tips: Please keep the boxes that topical medications come in in order to help keep track of the instructions about where and how to use these. Pharmacies typically print the medication instructions only on the boxes and not directly on the medication tubes.   If your medication is too expensive, please contact our office at [phone] option 4 or send [place] a message through MyChart.   We are unable to tell what your co-pay for medications will be in advance as this is different depending on your insurance coverage. However, we may be able to find a substitute medication at lower cost or fill out paperwork to get insurance to cover a needed medication.   If a prior authorization is required to get your medication  covered by your insurance company, please allow [place] 1-2 business days to complete this process.  Drug prices often vary depending on where the prescription is filled and some pharmacies may offer cheaper prices.  The website www.goodrx.com contains coupons for medications through different pharmacies. The prices here do not account for what the cost may be with help from insurance (it may be cheaper with your insurance), but the website can give you the price if you did not use any insurance.  - You can print the associated coupon and take it with your prescription to the pharmacy.  - You may also stop by our office during regular business hours and pick up a GoodRx coupon card.  - If you need your prescription sent electronically to a different pharmacy, notify our office through [facility] or by phone at [phone] option 4.     Si Usted Necesita Algo Despus de Su Visita  Tambin puede enviarnos un mensaje a travs de Clinical cytogeneticist. Por lo general respondemos a los mensajes de MyChart en el transcurso de 1 a 2 das hbiles.  Para renovar recetas, por favor pida a su farmacia que se ponga en contacto con nuestra oficina. Annie Sable de fax es [place] [phone].  Si tiene un asunto urgente cuando la clnica est cerrada y que no puede esperar hasta el siguiente da hbil, puede llamar/localizar a su doctor(a) al nmero que aparece a continuacin.   Por favor, tenga en cuenta que aunque hacemos todo lo posible para estar disponibles para asuntos urgentes fuera del horario de [place], no estamos disponibles las 24 horas del da, los 7 [address]  [address] de la [place].   Si tiene un problema urgente y no puede comunicarse con nosotros, puede optar por buscar atencin mdica  en el consultorio de su doctor(a), en una clnica privada, en un centro de atencin urgente o en una sala de emergencias.  Si tiene Engineer, drilling, por favor llame inmediatamente al 911 o vaya a la sala de  emergencias.  Nmeros de bper  - Dr. Gwen Pounds: [phone]  - Dra. Moye: [phone]  - Dra. Roseanne Reno: [phone]  En caso de inclemencias del [place], por favor llame a Lacy Duverney principal al [phone] para una actualizacin sobre el [place] de cualquier retraso o cierre.  Consejos para  la medicacin en dermatologa: Por favor, guarde las cajas en las que vienen los medicamentos de uso tpico para ayudarle a seguir las instrucciones sobre dnde y cmo usarlos. Las farmacias generalmente imprimen las instrucciones del medicamento slo en las cajas y no directamente en los tubos del [place].   Si su medicamento es muy caro, por favor, pngase en contacto con Rolm Gala llamando al [phone] y presione la opcin 4 o envenos un mensaje a travs de Clinical cytogeneticist.   No podemos decirle cul ser su copago por los medicamentos por adelantado ya que esto es diferente dependiendo de la cobertura de su seguro. Sin embargo, es posible que podamos encontrar un medicamento sustituto a Audiological scientist un formulario para que el seguro cubra el medicamento que se considera necesario.   Si se requiere una autorizacin previa para que su compaa de seguros [place] su medicamento, por favor permtanos de 1 a 2 das hbiles para completar [address]  Los precios de los medicamentos varan con frecuencia dependiendo del Environmental consultant de dnde se surte la receta y alguna farmacias pueden ofrecer precios ms baratos.  El sitio web www.goodrx.com tiene cupones para medicamentos de Health and safety inspector. Los precios aqu no tienen en cuenta lo que podra costar con la ayuda del seguro (puede ser ms barato con su seguro), pero el sitio web puede darle el precio si no utiliz Tourist information centre manager.  - Puede imprimir el cupn correspondiente y llevarlo con su receta a la farmacia.  - Tambin puede pasar por nuestra oficina durante el horario de atencin regular y Education officer, museum una tarjeta de cupones de GoodRx.  - Si  necesita que su receta se enve electrnicamente a una farmacia diferente, informe a nuestra oficina a travs de MyChart de [place] o por telfono llamando al [phone] y presione la opcin 4.

## 2023-03-16 LAB — COLOGUARD: COLOGUARD: NEGATIVE

## 2024-03-11 ENCOUNTER — Ambulatory Visit: Payer: PPO | Admitting: Dermatology

## 2024-03-11 DIAGNOSIS — Z1283 Encounter for screening for malignant neoplasm of skin: Secondary | ICD-10-CM | POA: Diagnosis not present

## 2024-03-11 DIAGNOSIS — W908XXA Exposure to other nonionizing radiation, initial encounter: Secondary | ICD-10-CM | POA: Diagnosis not present

## 2024-03-11 DIAGNOSIS — L858 Other specified epidermal thickening: Secondary | ICD-10-CM

## 2024-03-11 DIAGNOSIS — R21 Rash and other nonspecific skin eruption: Secondary | ICD-10-CM

## 2024-03-11 DIAGNOSIS — L578 Other skin changes due to chronic exposure to nonionizing radiation: Secondary | ICD-10-CM | POA: Diagnosis not present

## 2024-03-11 DIAGNOSIS — L814 Other melanin hyperpigmentation: Secondary | ICD-10-CM

## 2024-03-11 DIAGNOSIS — L821 Other seborrheic keratosis: Secondary | ICD-10-CM

## 2024-03-11 DIAGNOSIS — D229 Melanocytic nevi, unspecified: Secondary | ICD-10-CM

## 2024-03-11 DIAGNOSIS — D1801 Hemangioma of skin and subcutaneous tissue: Secondary | ICD-10-CM

## 2024-03-11 DIAGNOSIS — L570 Actinic keratosis: Secondary | ICD-10-CM | POA: Diagnosis not present

## 2024-03-11 DIAGNOSIS — L300 Nummular dermatitis: Secondary | ICD-10-CM

## 2024-03-11 DIAGNOSIS — Z872 Personal history of diseases of the skin and subcutaneous tissue: Secondary | ICD-10-CM

## 2024-03-11 MED ORDER — KETOCONAZOLE 2 % EX SHAM: MEDICATED_SHAMPOO | CUTANEOUS | 5 refills | Status: AC

## 2024-03-11 NOTE — Progress Notes (Signed)
 Follow-Up Visit   Subjective  Philip Johnson is a 68 y.o. male who presents for the following: Skin Cancer Screening and Upper Body Skin Exam  The patient presents for Upper Body Skin Exam (UBSE) for skin cancer screening and mole check. The patient has spots, moles and lesions to be evaluated, some may be new or changing. History of AKs.    The following portions of the chart were reviewed this encounter and updated as appropriate: medications, allergies, medical history  Review of Systems:  No other skin or systemic complaints except as noted in HPI or Assessment and Plan.  Objective  Well appearing patient in no apparent distress; mood and affect are within normal limits.  All skin waist up examined. Relevant physical exam findings are noted in the Assessment and Plan.  L foreahed x 2 Pink scaly macules.  Lentigo - upper nasal dorsum     Assessment & Plan   AK (ACTINIC KERATOSIS) L foreahed x 2 Actinic keratoses are precancerous spots that appear secondary to cumulative UV radiation exposure/sun exposure over time. They are chronic with expected duration over 1 year. A portion of actinic keratoses will progress to squamous cell carcinoma of the skin. It is not possible to reliably predict which spots will progress to skin cancer and so treatment is recommended to prevent development of skin cancer.  Recommend daily broad spectrum sunscreen SPF 30+ to sun-exposed areas, reapply every 2 hours as needed.  Recommend staying in the shade or wearing long sleeves, sun glasses (UVA+UVB protection) and wide brim hats (4-inch brim around the entire circumference of the hat). Call for new or changing lesions. Destruction of lesion - L foreahed x 2  Destruction method: cryotherapy   Informed consent: discussed and consent obtained   Lesion destroyed using liquid nitrogen: Yes   Region frozen until ice ball extended beyond lesion: Yes   Outcome: patient tolerated procedure well with no  complications   Post-procedure details: wound care instructions given   Additional details:  Prior to procedure, discussed risks of blister formation, small wound, skin dyspigmentation, or rare scar following cryotherapy. Recommend Vaseline ointment to treated areas while healing.  NUMMULAR DERMATITIS   Skin cancer screening performed today.  Actinic Damage - Chronic condition, secondary to cumulative UV/sun exposure - diffuse scaly erythematous macules with underlying dyspigmentation - Recommend daily broad spectrum sunscreen SPF 30+ to sun-exposed areas, reapply every 2 hours as needed.  - Staying in the shade or wearing long sleeves, sun glasses (UVA+UVB protection) and wide brim hats (4-inch brim around the entire circumference of the hat) are also recommended for sun protection.  - Call for new or changing lesions.  Lentigines, Seborrheic Keratoses, Hemangiomas - Benign normal skin lesions - Benign-appearing - Call for any changes  Melanocytic Nevi - Tan-brown and/or pink-flesh-colored symmetric macules and papules - Benign appearing on exam today - Observation - Call clinic for new or changing moles - Recommend daily use of broad spectrum spf 30+ sunscreen to sun-exposed areas.   LENTIGO Exam: 1.2 cm speckled tan patch at upper nasal dorsum Due to sun exposure, photo taken today. Treatment Plan: Benign-appearing, observe. Recommend daily broad spectrum sunscreen SPF 30+ to sun-exposed areas, reapply every 2 hours as needed.  Call for any changes  Nummular Dermatitis vs Tinea/Candida  Exam: Pink scaly patch at right lower back, left antecubitum, left lower flank, lower abdomen at waistline, no itching, no symptoms, present for years, worse when hot/sweaty.   Treatment Plan: Discussed 2 week  course oral antifungal, patient defers today. Not bothersome.  Didn't use ketoconazole  cream (doesn't like using creams) Start Ketoconazole  2% shampoo apply to affected areas in  shower as a body wash, let sit several minutes before rinsing dsp 120 mL 3Rf.  Recommend mild soap and moisturizing cream 1-2 times daily.    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 No cure. - Observe. - If desired, patient can use an emollient (moisturizer) containing ammonium lactate (AmLactin), urea or salicylic acid once a day to smooth the area  Recommend starting moisturizer with exfoliant (Urea, Salicylic acid, or Lactic acid) one to two times daily to help smooth rough and bumpy skin.  OTC options include Cetaphil Rough and Bumpy lotion (Urea), Eucerin Roughness Relief lotion or spot treatment cream (Urea), CeraVe SA lotion/cream for Rough and Bumpy skin (Sal Acid), [place] Rough and Bumpy cream (Sal Acid), and AmLactin 12% lotion/cream (Lactic Acid).  If applying in morning, also apply sunscreen to sun-exposed areas, since these exfoliating moisturizers can increase sensitivity to sun.   Return in about 1 year (around 03/11/2025) for UBSE, Hx AKs.  IAndrea Kerns, CMA, am acting as scribe for Rexene Rattler, MD .   Documentation: I have reviewed the above documentation for accuracy and completeness, and I agree with the above.  Rexene Rattler, MD

## 2024-03-11 NOTE — Patient Instructions (Signed)

## 2025-03-17 ENCOUNTER — Ambulatory Visit: Admitting: Dermatology
